# Patient Record
Sex: Male | Born: 1945 | Race: White | Hispanic: No | Marital: Married | Smoking: Former smoker
Health system: Southern US, Community
[De-identification: ages and names within clinical notes are randomized; demographics above are authoritative.]

## PROBLEM LIST (undated history)

## (undated) DIAGNOSIS — E119 Type 2 diabetes mellitus without complications: Secondary | ICD-10-CM

## (undated) DIAGNOSIS — I10 Essential (primary) hypertension: Secondary | ICD-10-CM

## (undated) DIAGNOSIS — I219 Acute myocardial infarction, unspecified: Secondary | ICD-10-CM

## (undated) DIAGNOSIS — E0590 Thyrotoxicosis, unspecified without thyrotoxic crisis or storm: Secondary | ICD-10-CM

## (undated) DIAGNOSIS — K219 Gastro-esophageal reflux disease without esophagitis: Secondary | ICD-10-CM

## (undated) HISTORY — PX: NECK SURGERY: SHX720

## (undated) HISTORY — PX: STOMACH SURGERY: SHX791

## (undated) HISTORY — DX: Essential (primary) hypertension: I10

## (undated) HISTORY — DX: Acute myocardial infarction, unspecified: I21.9

## (undated) HISTORY — PX: CHOLECYSTECTOMY: SHX55

## (undated) HISTORY — DX: Type 2 diabetes mellitus without complications: E11.9

## (undated) HISTORY — PX: OTHER SURGICAL HISTORY: SHX169

## (undated) HISTORY — DX: Gastro-esophageal reflux disease without esophagitis: K21.9

## (undated) HISTORY — DX: Thyrotoxicosis, unspecified without thyrotoxic crisis or storm: E05.90

---

## 1989-09-05 DIAGNOSIS — I219 Acute myocardial infarction, unspecified: Secondary | ICD-10-CM

## 1989-09-05 HISTORY — DX: Acute myocardial infarction, unspecified: I21.9

## 1998-10-22 ENCOUNTER — Ambulatory Visit (HOSPITAL_COMMUNITY): Admission: RE | Admit: 1998-10-22 | Discharge: 1998-10-22 | Payer: Self-pay

## 2001-06-04 ENCOUNTER — Ambulatory Visit (HOSPITAL_COMMUNITY): Admission: RE | Admit: 2001-06-04 | Discharge: 2001-06-04 | Payer: Self-pay | Admitting: Gastroenterology

## 2001-06-14 ENCOUNTER — Encounter: Admission: RE | Admit: 2001-06-14 | Discharge: 2001-06-14 | Payer: Self-pay | Admitting: Gastroenterology

## 2001-06-14 ENCOUNTER — Encounter: Payer: Self-pay | Admitting: Gastroenterology

## 2005-01-25 ENCOUNTER — Encounter: Admission: RE | Admit: 2005-01-25 | Discharge: 2005-01-25 | Payer: Self-pay | Admitting: Family Medicine

## 2005-09-07 ENCOUNTER — Encounter: Admission: RE | Admit: 2005-09-07 | Discharge: 2005-09-07 | Payer: Self-pay | Admitting: Family Medicine

## 2005-12-14 ENCOUNTER — Ambulatory Visit: Admission: RE | Admit: 2005-12-14 | Discharge: 2005-12-14 | Payer: Self-pay | Admitting: Orthopedic Surgery

## 2006-03-31 ENCOUNTER — Encounter: Admission: RE | Admit: 2006-03-31 | Discharge: 2006-03-31 | Payer: Self-pay | Admitting: Cardiology

## 2006-04-05 ENCOUNTER — Ambulatory Visit: Payer: Self-pay | Admitting: Family Medicine

## 2006-08-08 ENCOUNTER — Ambulatory Visit: Payer: Self-pay | Admitting: Family Medicine

## 2006-08-17 ENCOUNTER — Ambulatory Visit: Payer: Self-pay | Admitting: Family Medicine

## 2006-12-21 ENCOUNTER — Ambulatory Visit: Payer: Self-pay | Admitting: Family Medicine

## 2006-12-25 ENCOUNTER — Ambulatory Visit: Payer: Self-pay | Admitting: Family Medicine

## 2006-12-27 ENCOUNTER — Ambulatory Visit: Payer: Self-pay | Admitting: Family Medicine

## 2007-01-01 ENCOUNTER — Ambulatory Visit: Payer: Self-pay | Admitting: Family Medicine

## 2007-05-03 ENCOUNTER — Ambulatory Visit: Payer: Self-pay | Admitting: Family Medicine

## 2008-03-21 ENCOUNTER — Ambulatory Visit: Payer: Self-pay | Admitting: Family Medicine

## 2008-05-28 ENCOUNTER — Ambulatory Visit: Payer: Self-pay | Admitting: Thoracic Surgery (Cardiothoracic Vascular Surgery)

## 2008-09-11 ENCOUNTER — Ambulatory Visit: Payer: Self-pay | Admitting: Family Medicine

## 2008-09-17 ENCOUNTER — Ambulatory Visit: Payer: Self-pay | Admitting: Family Medicine

## 2009-02-20 ENCOUNTER — Ambulatory Visit: Payer: Self-pay | Admitting: Family Medicine

## 2009-04-09 ENCOUNTER — Ambulatory Visit: Payer: Self-pay | Admitting: Family Medicine

## 2009-10-13 ENCOUNTER — Ambulatory Visit: Payer: Self-pay | Admitting: Family Medicine

## 2011-01-21 NOTE — Procedures (Signed)
[facility]  Patient:    RICAHRD, SCHWAGER Visit Number: [id number] MRN: [id number]          Service Type: END Location: ENDO Attending Physician:  Orland Mustard Proc. Date: 06/04/01 Admit Date:  06/04/2001   CC:         Gaspar Garbe B. Little, M.D.   Procedure Report  PROCEDURE:  Colonoscopy.  MEDICATIONS:  Fentanyl 75 mcg, Versed 8 mg IV.  SCOPE:  Adult Olympus video colonoscope.  INDICATIONS:  Family history of colon polyps.  DESCRIPTION OF PROCEDURE:  The procedure had been explained to the patient and consent obtained.  With the patient in the left lateral decubitus position, the Olympus adult video colonoscope was inserted and advanced under direct visualization.  The prep was excellent.  We advanced to the cecum without difficulty.  The ileocecal valve and appendiceal orifice seen.  The scope was withdrawn.  The cecum, ascending colon, hepatic flexure, transverse colon, splenic flexure, descending, and sigmoid colon were seen well upon removal, and no polyps or other lesions seen.  The scope was withdrawn.  The patient tolerated the procedure well.  ASSESSMENT:  Normal colonoscopy in a high-risk individual.  PLAN:  We will recommend repeating in five years. Attending Physician:  Orland Mustard DD:  06/04/01 TD:  06/04/01 Job: [id number]

## 2011-07-12 ENCOUNTER — Telehealth: Payer: Self-pay | Admitting: Family Medicine

## 2011-07-12 NOTE — Telephone Encounter (Signed)
DONE

## 2011-11-21 DIAGNOSIS — Z0271 Encounter for disability determination: Secondary | ICD-10-CM

## 2014-12-04 DIAGNOSIS — M1711 Unilateral primary osteoarthritis, right knee: Secondary | ICD-10-CM | POA: Insufficient documentation

## 2015-02-26 DIAGNOSIS — M25562 Pain in left knee: Secondary | ICD-10-CM | POA: Insufficient documentation

## 2015-07-17 DIAGNOSIS — I2510 Atherosclerotic heart disease of native coronary artery without angina pectoris: Secondary | ICD-10-CM | POA: Insufficient documentation

## 2015-07-17 DIAGNOSIS — Z951 Presence of aortocoronary bypass graft: Secondary | ICD-10-CM | POA: Insufficient documentation

## 2015-07-17 DIAGNOSIS — I10 Essential (primary) hypertension: Secondary | ICD-10-CM | POA: Insufficient documentation

## 2015-07-17 DIAGNOSIS — E119 Type 2 diabetes mellitus without complications: Secondary | ICD-10-CM | POA: Insufficient documentation

## 2015-07-17 DIAGNOSIS — E785 Hyperlipidemia, unspecified: Secondary | ICD-10-CM | POA: Insufficient documentation

## 2015-09-17 DIAGNOSIS — M4722 Other spondylosis with radiculopathy, cervical region: Secondary | ICD-10-CM | POA: Diagnosis not present

## 2015-09-17 DIAGNOSIS — Z951 Presence of aortocoronary bypass graft: Secondary | ICD-10-CM | POA: Diagnosis not present

## 2015-09-17 DIAGNOSIS — M4712 Other spondylosis with myelopathy, cervical region: Secondary | ICD-10-CM | POA: Diagnosis not present

## 2015-09-17 DIAGNOSIS — Z01811 Encounter for preprocedural respiratory examination: Secondary | ICD-10-CM | POA: Diagnosis not present

## 2015-09-23 DIAGNOSIS — I2510 Atherosclerotic heart disease of native coronary artery without angina pectoris: Secondary | ICD-10-CM | POA: Diagnosis not present

## 2015-09-23 DIAGNOSIS — E784 Other hyperlipidemia: Secondary | ICD-10-CM | POA: Diagnosis not present

## 2015-09-23 DIAGNOSIS — I10 Essential (primary) hypertension: Secondary | ICD-10-CM | POA: Diagnosis not present

## 2015-09-23 DIAGNOSIS — R9431 Abnormal electrocardiogram [ECG] [EKG]: Secondary | ICD-10-CM | POA: Diagnosis not present

## 2015-09-23 DIAGNOSIS — Z951 Presence of aortocoronary bypass graft: Secondary | ICD-10-CM | POA: Diagnosis not present

## 2015-09-24 DIAGNOSIS — R9431 Abnormal electrocardiogram [ECG] [EKG]: Secondary | ICD-10-CM | POA: Diagnosis not present

## 2015-09-24 DIAGNOSIS — I2510 Atherosclerotic heart disease of native coronary artery without angina pectoris: Secondary | ICD-10-CM | POA: Diagnosis not present

## 2015-10-14 DIAGNOSIS — M25562 Pain in left knee: Secondary | ICD-10-CM | POA: Diagnosis not present

## 2015-10-15 DIAGNOSIS — G5621 Lesion of ulnar nerve, right upper limb: Secondary | ICD-10-CM | POA: Diagnosis not present

## 2015-10-15 DIAGNOSIS — M4722 Other spondylosis with radiculopathy, cervical region: Secondary | ICD-10-CM | POA: Diagnosis not present

## 2015-10-15 DIAGNOSIS — M4806 Spinal stenosis, lumbar region: Secondary | ICD-10-CM | POA: Diagnosis not present

## 2015-10-15 DIAGNOSIS — M6281 Muscle weakness (generalized): Secondary | ICD-10-CM | POA: Diagnosis not present

## 2015-10-15 DIAGNOSIS — R2689 Other abnormalities of gait and mobility: Secondary | ICD-10-CM | POA: Diagnosis not present

## 2015-10-15 DIAGNOSIS — Z951 Presence of aortocoronary bypass graft: Secondary | ICD-10-CM | POA: Diagnosis not present

## 2015-10-15 DIAGNOSIS — M4712 Other spondylosis with myelopathy, cervical region: Secondary | ICD-10-CM | POA: Diagnosis not present

## 2015-10-15 DIAGNOSIS — E0842 Diabetes mellitus due to underlying condition with diabetic polyneuropathy: Secondary | ICD-10-CM | POA: Diagnosis not present

## 2015-10-19 DIAGNOSIS — M1712 Unilateral primary osteoarthritis, left knee: Secondary | ICD-10-CM | POA: Diagnosis not present

## 2015-10-28 DIAGNOSIS — R208 Other disturbances of skin sensation: Secondary | ICD-10-CM | POA: Diagnosis not present

## 2015-10-28 DIAGNOSIS — M4322 Fusion of spine, cervical region: Secondary | ICD-10-CM | POA: Diagnosis not present

## 2015-10-28 DIAGNOSIS — I25810 Atherosclerosis of coronary artery bypass graft(s) without angina pectoris: Secondary | ICD-10-CM | POA: Diagnosis not present

## 2015-10-28 DIAGNOSIS — M4802 Spinal stenosis, cervical region: Secondary | ICD-10-CM | POA: Diagnosis not present

## 2015-10-28 DIAGNOSIS — Z79899 Other long term (current) drug therapy: Secondary | ICD-10-CM | POA: Diagnosis not present

## 2015-10-28 DIAGNOSIS — M4722 Other spondylosis with radiculopathy, cervical region: Secondary | ICD-10-CM | POA: Diagnosis not present

## 2015-10-28 DIAGNOSIS — Z9889 Other specified postprocedural states: Secondary | ICD-10-CM | POA: Diagnosis not present

## 2015-10-28 DIAGNOSIS — Z7984 Long term (current) use of oral hypoglycemic drugs: Secondary | ICD-10-CM | POA: Diagnosis not present

## 2015-10-28 DIAGNOSIS — E785 Hyperlipidemia, unspecified: Secondary | ICD-10-CM | POA: Diagnosis not present

## 2015-10-28 DIAGNOSIS — Z955 Presence of coronary angioplasty implant and graft: Secondary | ICD-10-CM | POA: Diagnosis not present

## 2015-10-28 DIAGNOSIS — I252 Old myocardial infarction: Secondary | ICD-10-CM | POA: Diagnosis not present

## 2015-10-28 DIAGNOSIS — K219 Gastro-esophageal reflux disease without esophagitis: Secondary | ICD-10-CM | POA: Diagnosis not present

## 2015-10-28 DIAGNOSIS — E119 Type 2 diabetes mellitus without complications: Secondary | ICD-10-CM | POA: Diagnosis not present

## 2015-10-28 DIAGNOSIS — Z951 Presence of aortocoronary bypass graft: Secondary | ICD-10-CM | POA: Diagnosis not present

## 2015-10-28 DIAGNOSIS — K449 Diaphragmatic hernia without obstruction or gangrene: Secondary | ICD-10-CM | POA: Diagnosis not present

## 2015-10-28 DIAGNOSIS — I10 Essential (primary) hypertension: Secondary | ICD-10-CM | POA: Diagnosis not present

## 2015-10-28 DIAGNOSIS — M4712 Other spondylosis with myelopathy, cervical region: Secondary | ICD-10-CM | POA: Diagnosis not present

## 2015-10-28 DIAGNOSIS — E039 Hypothyroidism, unspecified: Secondary | ICD-10-CM | POA: Diagnosis not present

## 2015-10-29 DIAGNOSIS — E119 Type 2 diabetes mellitus without complications: Secondary | ICD-10-CM | POA: Diagnosis not present

## 2015-10-29 DIAGNOSIS — M4712 Other spondylosis with myelopathy, cervical region: Secondary | ICD-10-CM | POA: Diagnosis not present

## 2015-11-09 DIAGNOSIS — M48061 Spinal stenosis, lumbar region without neurogenic claudication: Secondary | ICD-10-CM | POA: Insufficient documentation

## 2015-11-09 DIAGNOSIS — G5620 Lesion of ulnar nerve, unspecified upper limb: Secondary | ICD-10-CM | POA: Insufficient documentation

## 2015-11-24 DIAGNOSIS — M19071 Primary osteoarthritis, right ankle and foot: Secondary | ICD-10-CM | POA: Diagnosis not present

## 2015-11-24 DIAGNOSIS — L84 Corns and callosities: Secondary | ICD-10-CM | POA: Diagnosis not present

## 2015-11-24 DIAGNOSIS — M2141 Flat foot [pes planus] (acquired), right foot: Secondary | ICD-10-CM | POA: Diagnosis not present

## 2015-11-24 DIAGNOSIS — M79671 Pain in right foot: Secondary | ICD-10-CM | POA: Diagnosis not present

## 2015-11-25 DIAGNOSIS — Z981 Arthrodesis status: Secondary | ICD-10-CM | POA: Diagnosis not present

## 2015-11-25 DIAGNOSIS — M4712 Other spondylosis with myelopathy, cervical region: Secondary | ICD-10-CM | POA: Diagnosis not present

## 2015-11-25 DIAGNOSIS — Z09 Encounter for follow-up examination after completed treatment for conditions other than malignant neoplasm: Secondary | ICD-10-CM | POA: Diagnosis not present

## 2015-11-25 DIAGNOSIS — M2578 Osteophyte, vertebrae: Secondary | ICD-10-CM | POA: Diagnosis not present

## 2015-12-08 DIAGNOSIS — L97511 Non-pressure chronic ulcer of other part of right foot limited to breakdown of skin: Secondary | ICD-10-CM | POA: Diagnosis not present

## 2015-12-08 DIAGNOSIS — E1142 Type 2 diabetes mellitus with diabetic polyneuropathy: Secondary | ICD-10-CM | POA: Diagnosis not present

## 2015-12-08 DIAGNOSIS — M14671 Charcot's joint, right ankle and foot: Secondary | ICD-10-CM | POA: Insufficient documentation

## 2015-12-17 DIAGNOSIS — E1142 Type 2 diabetes mellitus with diabetic polyneuropathy: Secondary | ICD-10-CM | POA: Diagnosis not present

## 2015-12-17 DIAGNOSIS — M14671 Charcot's joint, right ankle and foot: Secondary | ICD-10-CM | POA: Diagnosis not present

## 2015-12-17 DIAGNOSIS — L97511 Non-pressure chronic ulcer of other part of right foot limited to breakdown of skin: Secondary | ICD-10-CM | POA: Diagnosis not present

## 2015-12-31 DIAGNOSIS — L97511 Non-pressure chronic ulcer of other part of right foot limited to breakdown of skin: Secondary | ICD-10-CM | POA: Diagnosis not present

## 2015-12-31 DIAGNOSIS — M14671 Charcot's joint, right ankle and foot: Secondary | ICD-10-CM | POA: Diagnosis not present

## 2015-12-31 DIAGNOSIS — E1142 Type 2 diabetes mellitus with diabetic polyneuropathy: Secondary | ICD-10-CM | POA: Diagnosis not present

## 2016-01-14 DIAGNOSIS — M14671 Charcot's joint, right ankle and foot: Secondary | ICD-10-CM | POA: Diagnosis not present

## 2016-01-14 DIAGNOSIS — L97511 Non-pressure chronic ulcer of other part of right foot limited to breakdown of skin: Secondary | ICD-10-CM | POA: Diagnosis not present

## 2016-01-14 DIAGNOSIS — E1142 Type 2 diabetes mellitus with diabetic polyneuropathy: Secondary | ICD-10-CM | POA: Diagnosis not present

## 2016-01-21 DIAGNOSIS — M79671 Pain in right foot: Secondary | ICD-10-CM | POA: Diagnosis not present

## 2016-01-21 DIAGNOSIS — E1142 Type 2 diabetes mellitus with diabetic polyneuropathy: Secondary | ICD-10-CM | POA: Diagnosis not present

## 2016-01-21 DIAGNOSIS — L84 Corns and callosities: Secondary | ICD-10-CM | POA: Diagnosis not present

## 2016-02-03 DIAGNOSIS — E119 Type 2 diabetes mellitus without complications: Secondary | ICD-10-CM | POA: Diagnosis not present

## 2016-02-05 DIAGNOSIS — M4806 Spinal stenosis, lumbar region: Secondary | ICD-10-CM | POA: Diagnosis not present

## 2016-02-16 DIAGNOSIS — E1142 Type 2 diabetes mellitus with diabetic polyneuropathy: Secondary | ICD-10-CM | POA: Diagnosis not present

## 2016-02-16 DIAGNOSIS — M14671 Charcot's joint, right ankle and foot: Secondary | ICD-10-CM | POA: Diagnosis not present

## 2016-02-16 DIAGNOSIS — L97511 Non-pressure chronic ulcer of other part of right foot limited to breakdown of skin: Secondary | ICD-10-CM | POA: Diagnosis not present

## 2016-03-17 DIAGNOSIS — M14671 Charcot's joint, right ankle and foot: Secondary | ICD-10-CM | POA: Diagnosis not present

## 2016-03-17 DIAGNOSIS — E1142 Type 2 diabetes mellitus with diabetic polyneuropathy: Secondary | ICD-10-CM | POA: Diagnosis not present

## 2016-03-28 DIAGNOSIS — Z01818 Encounter for other preprocedural examination: Secondary | ICD-10-CM | POA: Diagnosis not present

## 2016-04-16 DIAGNOSIS — Z981 Arthrodesis status: Secondary | ICD-10-CM | POA: Insufficient documentation

## 2016-04-18 DIAGNOSIS — R262 Difficulty in walking, not elsewhere classified: Secondary | ICD-10-CM | POA: Diagnosis not present

## 2016-04-18 DIAGNOSIS — M170 Bilateral primary osteoarthritis of knee: Secondary | ICD-10-CM | POA: Diagnosis not present

## 2016-04-18 DIAGNOSIS — M1712 Unilateral primary osteoarthritis, left knee: Secondary | ICD-10-CM | POA: Diagnosis not present

## 2016-04-18 DIAGNOSIS — M25561 Pain in right knee: Secondary | ICD-10-CM | POA: Diagnosis not present

## 2016-04-18 DIAGNOSIS — M25562 Pain in left knee: Secondary | ICD-10-CM | POA: Diagnosis not present

## 2016-04-25 DIAGNOSIS — E1142 Type 2 diabetes mellitus with diabetic polyneuropathy: Secondary | ICD-10-CM | POA: Diagnosis not present

## 2016-04-25 DIAGNOSIS — L97511 Non-pressure chronic ulcer of other part of right foot limited to breakdown of skin: Secondary | ICD-10-CM | POA: Diagnosis not present

## 2016-04-25 DIAGNOSIS — M14672 Charcot's joint, left ankle and foot: Secondary | ICD-10-CM | POA: Diagnosis not present

## 2016-04-25 DIAGNOSIS — M14671 Charcot's joint, right ankle and foot: Secondary | ICD-10-CM | POA: Diagnosis not present

## 2016-05-05 DIAGNOSIS — L97512 Non-pressure chronic ulcer of other part of right foot with fat layer exposed: Secondary | ICD-10-CM | POA: Diagnosis not present

## 2016-05-05 DIAGNOSIS — L03115 Cellulitis of right lower limb: Secondary | ICD-10-CM | POA: Diagnosis not present

## 2016-05-05 DIAGNOSIS — E1142 Type 2 diabetes mellitus with diabetic polyneuropathy: Secondary | ICD-10-CM | POA: Diagnosis not present

## 2016-05-05 DIAGNOSIS — M14671 Charcot's joint, right ankle and foot: Secondary | ICD-10-CM | POA: Diagnosis not present

## 2016-05-12 DIAGNOSIS — L97511 Non-pressure chronic ulcer of other part of right foot limited to breakdown of skin: Secondary | ICD-10-CM | POA: Diagnosis not present

## 2016-05-12 DIAGNOSIS — E1142 Type 2 diabetes mellitus with diabetic polyneuropathy: Secondary | ICD-10-CM | POA: Diagnosis not present

## 2016-05-12 DIAGNOSIS — M14671 Charcot's joint, right ankle and foot: Secondary | ICD-10-CM | POA: Diagnosis not present

## 2016-05-18 DIAGNOSIS — M4806 Spinal stenosis, lumbar region: Secondary | ICD-10-CM | POA: Diagnosis not present

## 2016-05-26 DIAGNOSIS — M14671 Charcot's joint, right ankle and foot: Secondary | ICD-10-CM | POA: Diagnosis not present

## 2016-05-26 DIAGNOSIS — E1142 Type 2 diabetes mellitus with diabetic polyneuropathy: Secondary | ICD-10-CM | POA: Diagnosis not present

## 2016-05-26 DIAGNOSIS — L97511 Non-pressure chronic ulcer of other part of right foot limited to breakdown of skin: Secondary | ICD-10-CM | POA: Diagnosis not present

## 2016-05-31 DIAGNOSIS — M6701 Short Achilles tendon (acquired), right ankle: Secondary | ICD-10-CM | POA: Diagnosis not present

## 2016-05-31 DIAGNOSIS — M14671 Charcot's joint, right ankle and foot: Secondary | ICD-10-CM | POA: Diagnosis not present

## 2016-05-31 DIAGNOSIS — L97512 Non-pressure chronic ulcer of other part of right foot with fat layer exposed: Secondary | ICD-10-CM | POA: Diagnosis not present

## 2016-05-31 DIAGNOSIS — E1140 Type 2 diabetes mellitus with diabetic neuropathy, unspecified: Secondary | ICD-10-CM | POA: Diagnosis not present

## 2016-05-31 DIAGNOSIS — L97519 Non-pressure chronic ulcer of other part of right foot with unspecified severity: Secondary | ICD-10-CM | POA: Diagnosis not present

## 2016-05-31 DIAGNOSIS — Z794 Long term (current) use of insulin: Secondary | ICD-10-CM | POA: Diagnosis not present

## 2016-06-14 DIAGNOSIS — M14671 Charcot's joint, right ankle and foot: Secondary | ICD-10-CM | POA: Diagnosis not present

## 2016-06-14 DIAGNOSIS — L97512 Non-pressure chronic ulcer of other part of right foot with fat layer exposed: Secondary | ICD-10-CM | POA: Diagnosis not present

## 2016-06-14 DIAGNOSIS — E1142 Type 2 diabetes mellitus with diabetic polyneuropathy: Secondary | ICD-10-CM | POA: Diagnosis not present

## 2016-06-17 DIAGNOSIS — R0602 Shortness of breath: Secondary | ICD-10-CM | POA: Diagnosis not present

## 2016-06-17 DIAGNOSIS — I2510 Atherosclerotic heart disease of native coronary artery without angina pectoris: Secondary | ICD-10-CM | POA: Diagnosis not present

## 2016-06-21 DIAGNOSIS — M14671 Charcot's joint, right ankle and foot: Secondary | ICD-10-CM | POA: Diagnosis not present

## 2016-06-21 DIAGNOSIS — L97512 Non-pressure chronic ulcer of other part of right foot with fat layer exposed: Secondary | ICD-10-CM | POA: Diagnosis not present

## 2016-06-21 DIAGNOSIS — M6701 Short Achilles tendon (acquired), right ankle: Secondary | ICD-10-CM | POA: Diagnosis not present

## 2016-07-05 DIAGNOSIS — E1142 Type 2 diabetes mellitus with diabetic polyneuropathy: Secondary | ICD-10-CM | POA: Diagnosis not present

## 2016-07-05 DIAGNOSIS — L97511 Non-pressure chronic ulcer of other part of right foot limited to breakdown of skin: Secondary | ICD-10-CM | POA: Diagnosis not present

## 2016-07-19 DIAGNOSIS — E1165 Type 2 diabetes mellitus with hyperglycemia: Secondary | ICD-10-CM | POA: Diagnosis not present

## 2016-07-19 DIAGNOSIS — E1142 Type 2 diabetes mellitus with diabetic polyneuropathy: Secondary | ICD-10-CM | POA: Diagnosis not present

## 2016-07-19 DIAGNOSIS — L97511 Non-pressure chronic ulcer of other part of right foot limited to breakdown of skin: Secondary | ICD-10-CM | POA: Diagnosis not present

## 2016-07-19 DIAGNOSIS — M14671 Charcot's joint, right ankle and foot: Secondary | ICD-10-CM | POA: Diagnosis not present

## 2016-07-20 DIAGNOSIS — G4733 Obstructive sleep apnea (adult) (pediatric): Secondary | ICD-10-CM | POA: Diagnosis not present

## 2016-07-20 DIAGNOSIS — I252 Old myocardial infarction: Secondary | ICD-10-CM | POA: Diagnosis not present

## 2016-07-20 DIAGNOSIS — E1140 Type 2 diabetes mellitus with diabetic neuropathy, unspecified: Secondary | ICD-10-CM | POA: Diagnosis not present

## 2016-07-20 DIAGNOSIS — E669 Obesity, unspecified: Secondary | ICD-10-CM | POA: Diagnosis not present

## 2016-07-20 DIAGNOSIS — E785 Hyperlipidemia, unspecified: Secondary | ICD-10-CM | POA: Diagnosis not present

## 2016-07-20 DIAGNOSIS — M1990 Unspecified osteoarthritis, unspecified site: Secondary | ICD-10-CM | POA: Diagnosis not present

## 2016-07-20 DIAGNOSIS — Z6834 Body mass index (BMI) 34.0-34.9, adult: Secondary | ICD-10-CM | POA: Diagnosis not present

## 2016-07-20 DIAGNOSIS — K219 Gastro-esophageal reflux disease without esophagitis: Secondary | ICD-10-CM | POA: Diagnosis not present

## 2016-07-20 DIAGNOSIS — R911 Solitary pulmonary nodule: Secondary | ICD-10-CM | POA: Diagnosis not present

## 2016-07-20 DIAGNOSIS — R531 Weakness: Secondary | ICD-10-CM | POA: Diagnosis not present

## 2016-07-20 DIAGNOSIS — A419 Sepsis, unspecified organism: Secondary | ICD-10-CM | POA: Diagnosis not present

## 2016-07-20 DIAGNOSIS — R079 Chest pain, unspecified: Secondary | ICD-10-CM | POA: Diagnosis not present

## 2016-07-20 DIAGNOSIS — Z7952 Long term (current) use of systemic steroids: Secondary | ICD-10-CM | POA: Diagnosis not present

## 2016-07-20 DIAGNOSIS — Z951 Presence of aortocoronary bypass graft: Secondary | ICD-10-CM | POA: Diagnosis not present

## 2016-07-20 DIAGNOSIS — J159 Unspecified bacterial pneumonia: Secondary | ICD-10-CM | POA: Diagnosis not present

## 2016-07-20 DIAGNOSIS — Z87891 Personal history of nicotine dependence: Secondary | ICD-10-CM | POA: Diagnosis not present

## 2016-07-20 DIAGNOSIS — Z7982 Long term (current) use of aspirin: Secondary | ICD-10-CM | POA: Diagnosis not present

## 2016-07-20 DIAGNOSIS — R05 Cough: Secondary | ICD-10-CM | POA: Diagnosis not present

## 2016-07-20 DIAGNOSIS — Z7984 Long term (current) use of oral hypoglycemic drugs: Secondary | ICD-10-CM | POA: Diagnosis not present

## 2016-07-20 DIAGNOSIS — Z8673 Personal history of transient ischemic attack (TIA), and cerebral infarction without residual deficits: Secondary | ICD-10-CM | POA: Diagnosis not present

## 2016-07-20 DIAGNOSIS — I2510 Atherosclerotic heart disease of native coronary artery without angina pectoris: Secondary | ICD-10-CM | POA: Diagnosis not present

## 2016-07-20 DIAGNOSIS — I10 Essential (primary) hypertension: Secondary | ICD-10-CM | POA: Diagnosis not present

## 2016-07-20 DIAGNOSIS — E039 Hypothyroidism, unspecified: Secondary | ICD-10-CM | POA: Diagnosis not present

## 2016-07-21 DIAGNOSIS — I361 Nonrheumatic tricuspid (valve) insufficiency: Secondary | ICD-10-CM | POA: Diagnosis not present

## 2016-07-21 DIAGNOSIS — E785 Hyperlipidemia, unspecified: Secondary | ICD-10-CM

## 2016-07-21 DIAGNOSIS — J159 Unspecified bacterial pneumonia: Secondary | ICD-10-CM | POA: Diagnosis not present

## 2016-07-21 DIAGNOSIS — E039 Hypothyroidism, unspecified: Secondary | ICD-10-CM

## 2016-07-21 DIAGNOSIS — G4733 Obstructive sleep apnea (adult) (pediatric): Secondary | ICD-10-CM

## 2016-07-21 DIAGNOSIS — M1990 Unspecified osteoarthritis, unspecified site: Secondary | ICD-10-CM

## 2016-07-21 DIAGNOSIS — R911 Solitary pulmonary nodule: Secondary | ICD-10-CM | POA: Diagnosis not present

## 2016-07-21 DIAGNOSIS — I10 Essential (primary) hypertension: Secondary | ICD-10-CM

## 2016-07-21 DIAGNOSIS — Z951 Presence of aortocoronary bypass graft: Secondary | ICD-10-CM

## 2016-07-21 DIAGNOSIS — R0600 Dyspnea, unspecified: Secondary | ICD-10-CM | POA: Diagnosis not present

## 2016-07-21 DIAGNOSIS — E1140 Type 2 diabetes mellitus with diabetic neuropathy, unspecified: Secondary | ICD-10-CM

## 2016-07-21 DIAGNOSIS — I2510 Atherosclerotic heart disease of native coronary artery without angina pectoris: Secondary | ICD-10-CM | POA: Diagnosis not present

## 2016-07-21 DIAGNOSIS — A419 Sepsis, unspecified organism: Secondary | ICD-10-CM | POA: Diagnosis not present

## 2016-07-21 DIAGNOSIS — E669 Obesity, unspecified: Secondary | ICD-10-CM

## 2016-07-26 DIAGNOSIS — R062 Wheezing: Secondary | ICD-10-CM | POA: Diagnosis not present

## 2016-07-26 DIAGNOSIS — J181 Lobar pneumonia, unspecified organism: Secondary | ICD-10-CM | POA: Diagnosis not present

## 2016-07-26 DIAGNOSIS — R911 Solitary pulmonary nodule: Secondary | ICD-10-CM | POA: Diagnosis not present

## 2016-08-02 DIAGNOSIS — M6701 Short Achilles tendon (acquired), right ankle: Secondary | ICD-10-CM | POA: Diagnosis not present

## 2016-08-02 DIAGNOSIS — M14671 Charcot's joint, right ankle and foot: Secondary | ICD-10-CM | POA: Diagnosis not present

## 2016-08-02 DIAGNOSIS — L97512 Non-pressure chronic ulcer of other part of right foot with fat layer exposed: Secondary | ICD-10-CM | POA: Diagnosis not present

## 2016-08-02 DIAGNOSIS — E1140 Type 2 diabetes mellitus with diabetic neuropathy, unspecified: Secondary | ICD-10-CM | POA: Diagnosis not present

## 2016-08-02 DIAGNOSIS — M7751 Other enthesopathy of right foot: Secondary | ICD-10-CM | POA: Diagnosis not present

## 2016-08-05 DIAGNOSIS — E1142 Type 2 diabetes mellitus with diabetic polyneuropathy: Secondary | ICD-10-CM | POA: Diagnosis not present

## 2016-08-05 DIAGNOSIS — B370 Candidal stomatitis: Secondary | ICD-10-CM | POA: Diagnosis not present

## 2016-08-05 DIAGNOSIS — I10 Essential (primary) hypertension: Secondary | ICD-10-CM | POA: Diagnosis not present

## 2016-08-05 DIAGNOSIS — E1165 Type 2 diabetes mellitus with hyperglycemia: Secondary | ICD-10-CM | POA: Diagnosis not present

## 2016-08-08 DIAGNOSIS — E119 Type 2 diabetes mellitus without complications: Secondary | ICD-10-CM | POA: Diagnosis not present

## 2016-08-16 DIAGNOSIS — M7751 Other enthesopathy of right foot: Secondary | ICD-10-CM | POA: Diagnosis not present

## 2016-08-16 DIAGNOSIS — M14671 Charcot's joint, right ankle and foot: Secondary | ICD-10-CM | POA: Diagnosis not present

## 2016-08-16 DIAGNOSIS — E1142 Type 2 diabetes mellitus with diabetic polyneuropathy: Secondary | ICD-10-CM | POA: Diagnosis not present

## 2016-08-16 DIAGNOSIS — L97512 Non-pressure chronic ulcer of other part of right foot with fat layer exposed: Secondary | ICD-10-CM | POA: Diagnosis not present

## 2016-09-06 DIAGNOSIS — L97512 Non-pressure chronic ulcer of other part of right foot with fat layer exposed: Secondary | ICD-10-CM | POA: Diagnosis not present

## 2016-09-19 DIAGNOSIS — E119 Type 2 diabetes mellitus without complications: Secondary | ICD-10-CM | POA: Diagnosis not present

## 2016-09-30 DIAGNOSIS — R221 Localized swelling, mass and lump, neck: Secondary | ICD-10-CM | POA: Diagnosis not present

## 2016-10-04 DIAGNOSIS — L97512 Non-pressure chronic ulcer of other part of right foot with fat layer exposed: Secondary | ICD-10-CM | POA: Diagnosis not present

## 2016-10-04 DIAGNOSIS — E1142 Type 2 diabetes mellitus with diabetic polyneuropathy: Secondary | ICD-10-CM | POA: Diagnosis not present

## 2016-10-04 DIAGNOSIS — M6701 Short Achilles tendon (acquired), right ankle: Secondary | ICD-10-CM | POA: Diagnosis not present

## 2016-10-06 DIAGNOSIS — M21071 Valgus deformity, not elsewhere classified, right ankle: Secondary | ICD-10-CM | POA: Insufficient documentation

## 2016-10-24 DIAGNOSIS — E1165 Type 2 diabetes mellitus with hyperglycemia: Secondary | ICD-10-CM | POA: Diagnosis not present

## 2016-10-25 DIAGNOSIS — M7751 Other enthesopathy of right foot: Secondary | ICD-10-CM | POA: Diagnosis not present

## 2016-10-25 DIAGNOSIS — L97512 Non-pressure chronic ulcer of other part of right foot with fat layer exposed: Secondary | ICD-10-CM | POA: Diagnosis not present

## 2016-10-25 DIAGNOSIS — M216X1 Other acquired deformities of right foot: Secondary | ICD-10-CM | POA: Diagnosis not present

## 2016-10-25 DIAGNOSIS — E1142 Type 2 diabetes mellitus with diabetic polyneuropathy: Secondary | ICD-10-CM | POA: Diagnosis not present

## 2016-10-25 DIAGNOSIS — M6701 Short Achilles tendon (acquired), right ankle: Secondary | ICD-10-CM | POA: Diagnosis not present

## 2016-10-25 DIAGNOSIS — L03115 Cellulitis of right lower limb: Secondary | ICD-10-CM | POA: Diagnosis not present

## 2016-11-01 DIAGNOSIS — M7751 Other enthesopathy of right foot: Secondary | ICD-10-CM | POA: Diagnosis not present

## 2016-11-01 DIAGNOSIS — L97512 Non-pressure chronic ulcer of other part of right foot with fat layer exposed: Secondary | ICD-10-CM | POA: Diagnosis not present

## 2016-11-01 DIAGNOSIS — M14671 Charcot's joint, right ankle and foot: Secondary | ICD-10-CM | POA: Diagnosis not present

## 2016-11-15 DIAGNOSIS — M6701 Short Achilles tendon (acquired), right ankle: Secondary | ICD-10-CM | POA: Diagnosis not present

## 2016-11-15 DIAGNOSIS — E1142 Type 2 diabetes mellitus with diabetic polyneuropathy: Secondary | ICD-10-CM | POA: Diagnosis not present

## 2016-11-15 DIAGNOSIS — M216X1 Other acquired deformities of right foot: Secondary | ICD-10-CM | POA: Diagnosis not present

## 2016-11-15 DIAGNOSIS — L97512 Non-pressure chronic ulcer of other part of right foot with fat layer exposed: Secondary | ICD-10-CM | POA: Diagnosis not present

## 2016-12-06 DIAGNOSIS — I10 Essential (primary) hypertension: Secondary | ICD-10-CM | POA: Diagnosis not present

## 2016-12-06 DIAGNOSIS — E1142 Type 2 diabetes mellitus with diabetic polyneuropathy: Secondary | ICD-10-CM | POA: Diagnosis not present

## 2016-12-06 DIAGNOSIS — Z01810 Encounter for preprocedural cardiovascular examination: Secondary | ICD-10-CM | POA: Diagnosis not present

## 2016-12-06 DIAGNOSIS — E1165 Type 2 diabetes mellitus with hyperglycemia: Secondary | ICD-10-CM | POA: Diagnosis not present

## 2016-12-08 DIAGNOSIS — M14671 Charcot's joint, right ankle and foot: Secondary | ICD-10-CM | POA: Diagnosis not present

## 2016-12-08 DIAGNOSIS — M7751 Other enthesopathy of right foot: Secondary | ICD-10-CM | POA: Diagnosis not present

## 2016-12-08 DIAGNOSIS — L97512 Non-pressure chronic ulcer of other part of right foot with fat layer exposed: Secondary | ICD-10-CM | POA: Diagnosis not present

## 2016-12-16 DIAGNOSIS — M7751 Other enthesopathy of right foot: Secondary | ICD-10-CM | POA: Diagnosis not present

## 2016-12-16 DIAGNOSIS — E1142 Type 2 diabetes mellitus with diabetic polyneuropathy: Secondary | ICD-10-CM | POA: Diagnosis not present

## 2016-12-16 DIAGNOSIS — E785 Hyperlipidemia, unspecified: Secondary | ICD-10-CM | POA: Diagnosis not present

## 2016-12-16 DIAGNOSIS — Z7984 Long term (current) use of oral hypoglycemic drugs: Secondary | ICD-10-CM | POA: Diagnosis not present

## 2016-12-16 DIAGNOSIS — I10 Essential (primary) hypertension: Secondary | ICD-10-CM | POA: Diagnosis not present

## 2016-12-16 DIAGNOSIS — Z886 Allergy status to analgesic agent status: Secondary | ICD-10-CM | POA: Diagnosis not present

## 2016-12-16 DIAGNOSIS — Z87891 Personal history of nicotine dependence: Secondary | ICD-10-CM | POA: Diagnosis not present

## 2016-12-16 DIAGNOSIS — M25774 Osteophyte, right foot: Secondary | ICD-10-CM | POA: Diagnosis not present

## 2016-12-16 DIAGNOSIS — I2510 Atherosclerotic heart disease of native coronary artery without angina pectoris: Secondary | ICD-10-CM | POA: Diagnosis not present

## 2016-12-16 DIAGNOSIS — Z6830 Body mass index (BMI) 30.0-30.9, adult: Secondary | ICD-10-CM | POA: Diagnosis not present

## 2016-12-16 DIAGNOSIS — Z8673 Personal history of transient ischemic attack (TIA), and cerebral infarction without residual deficits: Secondary | ICD-10-CM | POA: Diagnosis not present

## 2016-12-16 DIAGNOSIS — Z955 Presence of coronary angioplasty implant and graft: Secondary | ICD-10-CM | POA: Diagnosis not present

## 2016-12-16 DIAGNOSIS — K219 Gastro-esophageal reflux disease without esophagitis: Secondary | ICD-10-CM | POA: Diagnosis not present

## 2016-12-16 DIAGNOSIS — Z951 Presence of aortocoronary bypass graft: Secondary | ICD-10-CM | POA: Diagnosis not present

## 2016-12-16 DIAGNOSIS — E669 Obesity, unspecified: Secondary | ICD-10-CM | POA: Diagnosis not present

## 2016-12-28 DIAGNOSIS — I10 Essential (primary) hypertension: Secondary | ICD-10-CM | POA: Diagnosis not present

## 2016-12-28 DIAGNOSIS — G4733 Obstructive sleep apnea (adult) (pediatric): Secondary | ICD-10-CM | POA: Diagnosis not present

## 2016-12-28 DIAGNOSIS — E1142 Type 2 diabetes mellitus with diabetic polyneuropathy: Secondary | ICD-10-CM | POA: Diagnosis not present

## 2017-01-17 DIAGNOSIS — M7751 Other enthesopathy of right foot: Secondary | ICD-10-CM | POA: Diagnosis not present

## 2017-01-19 DIAGNOSIS — D509 Iron deficiency anemia, unspecified: Secondary | ICD-10-CM | POA: Diagnosis not present

## 2017-01-19 DIAGNOSIS — G4733 Obstructive sleep apnea (adult) (pediatric): Secondary | ICD-10-CM | POA: Diagnosis not present

## 2017-01-19 DIAGNOSIS — Z79899 Other long term (current) drug therapy: Secondary | ICD-10-CM | POA: Diagnosis not present

## 2017-01-19 DIAGNOSIS — R5383 Other fatigue: Secondary | ICD-10-CM | POA: Diagnosis not present

## 2017-01-19 DIAGNOSIS — E039 Hypothyroidism, unspecified: Secondary | ICD-10-CM | POA: Diagnosis not present

## 2017-01-19 DIAGNOSIS — R160 Hepatomegaly, not elsewhere classified: Secondary | ICD-10-CM | POA: Diagnosis not present

## 2017-01-19 DIAGNOSIS — R140 Abdominal distension (gaseous): Secondary | ICD-10-CM | POA: Diagnosis not present

## 2017-01-24 DIAGNOSIS — Z951 Presence of aortocoronary bypass graft: Secondary | ICD-10-CM | POA: Diagnosis not present

## 2017-01-24 DIAGNOSIS — E785 Hyperlipidemia, unspecified: Secondary | ICD-10-CM | POA: Diagnosis not present

## 2017-01-24 DIAGNOSIS — I252 Old myocardial infarction: Secondary | ICD-10-CM | POA: Diagnosis not present

## 2017-01-24 DIAGNOSIS — R0602 Shortness of breath: Secondary | ICD-10-CM | POA: Diagnosis not present

## 2017-01-24 DIAGNOSIS — I10 Essential (primary) hypertension: Secondary | ICD-10-CM | POA: Diagnosis not present

## 2017-01-24 DIAGNOSIS — I2510 Atherosclerotic heart disease of native coronary artery without angina pectoris: Secondary | ICD-10-CM | POA: Diagnosis not present

## 2017-01-24 DIAGNOSIS — E119 Type 2 diabetes mellitus without complications: Secondary | ICD-10-CM | POA: Diagnosis not present

## 2017-02-01 DIAGNOSIS — I10 Essential (primary) hypertension: Secondary | ICD-10-CM | POA: Diagnosis not present

## 2017-02-01 DIAGNOSIS — E039 Hypothyroidism, unspecified: Secondary | ICD-10-CM | POA: Diagnosis not present

## 2017-02-01 DIAGNOSIS — E119 Type 2 diabetes mellitus without complications: Secondary | ICD-10-CM | POA: Diagnosis not present

## 2017-02-01 DIAGNOSIS — R609 Edema, unspecified: Secondary | ICD-10-CM | POA: Diagnosis not present

## 2017-02-08 DIAGNOSIS — E1142 Type 2 diabetes mellitus with diabetic polyneuropathy: Secondary | ICD-10-CM | POA: Diagnosis not present

## 2017-02-08 DIAGNOSIS — I10 Essential (primary) hypertension: Secondary | ICD-10-CM | POA: Diagnosis not present

## 2017-02-17 DIAGNOSIS — R1031 Right lower quadrant pain: Secondary | ICD-10-CM | POA: Diagnosis not present

## 2017-02-17 DIAGNOSIS — R634 Abnormal weight loss: Secondary | ICD-10-CM | POA: Diagnosis not present

## 2017-02-17 DIAGNOSIS — D509 Iron deficiency anemia, unspecified: Secondary | ICD-10-CM | POA: Diagnosis not present

## 2017-02-17 DIAGNOSIS — R197 Diarrhea, unspecified: Secondary | ICD-10-CM | POA: Diagnosis not present

## 2017-02-24 DIAGNOSIS — N400 Enlarged prostate without lower urinary tract symptoms: Secondary | ICD-10-CM | POA: Diagnosis not present

## 2017-02-24 DIAGNOSIS — R634 Abnormal weight loss: Secondary | ICD-10-CM | POA: Diagnosis not present

## 2017-02-24 DIAGNOSIS — R911 Solitary pulmonary nodule: Secondary | ICD-10-CM | POA: Diagnosis not present

## 2017-02-24 DIAGNOSIS — R1031 Right lower quadrant pain: Secondary | ICD-10-CM | POA: Diagnosis not present

## 2017-02-24 DIAGNOSIS — R197 Diarrhea, unspecified: Secondary | ICD-10-CM | POA: Diagnosis not present

## 2017-02-24 DIAGNOSIS — I2510 Atherosclerotic heart disease of native coronary artery without angina pectoris: Secondary | ICD-10-CM | POA: Diagnosis not present

## 2017-03-20 DIAGNOSIS — G4733 Obstructive sleep apnea (adult) (pediatric): Secondary | ICD-10-CM | POA: Diagnosis not present

## 2017-03-20 DIAGNOSIS — K5730 Diverticulosis of large intestine without perforation or abscess without bleeding: Secondary | ICD-10-CM | POA: Diagnosis not present

## 2017-03-20 DIAGNOSIS — I252 Old myocardial infarction: Secondary | ICD-10-CM | POA: Diagnosis not present

## 2017-03-20 DIAGNOSIS — R1031 Right lower quadrant pain: Secondary | ICD-10-CM | POA: Diagnosis not present

## 2017-03-20 DIAGNOSIS — K635 Polyp of colon: Secondary | ICD-10-CM | POA: Diagnosis not present

## 2017-03-20 DIAGNOSIS — Z87891 Personal history of nicotine dependence: Secondary | ICD-10-CM | POA: Diagnosis not present

## 2017-03-20 DIAGNOSIS — J449 Chronic obstructive pulmonary disease, unspecified: Secondary | ICD-10-CM | POA: Diagnosis not present

## 2017-03-20 DIAGNOSIS — E785 Hyperlipidemia, unspecified: Secondary | ICD-10-CM | POA: Diagnosis not present

## 2017-03-20 DIAGNOSIS — M1990 Unspecified osteoarthritis, unspecified site: Secondary | ICD-10-CM | POA: Diagnosis not present

## 2017-03-20 DIAGNOSIS — K648 Other hemorrhoids: Secondary | ICD-10-CM | POA: Diagnosis not present

## 2017-03-20 DIAGNOSIS — E039 Hypothyroidism, unspecified: Secondary | ICD-10-CM | POA: Diagnosis not present

## 2017-03-20 DIAGNOSIS — I2510 Atherosclerotic heart disease of native coronary artery without angina pectoris: Secondary | ICD-10-CM | POA: Diagnosis not present

## 2017-03-20 DIAGNOSIS — D509 Iron deficiency anemia, unspecified: Secondary | ICD-10-CM | POA: Diagnosis not present

## 2017-03-20 DIAGNOSIS — R634 Abnormal weight loss: Secondary | ICD-10-CM | POA: Diagnosis not present

## 2017-03-20 DIAGNOSIS — K219 Gastro-esophageal reflux disease without esophagitis: Secondary | ICD-10-CM | POA: Diagnosis not present

## 2017-03-20 DIAGNOSIS — E119 Type 2 diabetes mellitus without complications: Secondary | ICD-10-CM | POA: Diagnosis not present

## 2017-03-20 DIAGNOSIS — D125 Benign neoplasm of sigmoid colon: Secondary | ICD-10-CM | POA: Diagnosis not present

## 2017-03-20 DIAGNOSIS — R197 Diarrhea, unspecified: Secondary | ICD-10-CM | POA: Diagnosis not present

## 2017-03-20 DIAGNOSIS — G629 Polyneuropathy, unspecified: Secondary | ICD-10-CM | POA: Diagnosis not present

## 2017-03-20 DIAGNOSIS — Z951 Presence of aortocoronary bypass graft: Secondary | ICD-10-CM | POA: Diagnosis not present

## 2017-03-20 DIAGNOSIS — I10 Essential (primary) hypertension: Secondary | ICD-10-CM | POA: Diagnosis not present

## 2017-03-20 DIAGNOSIS — Z79899 Other long term (current) drug therapy: Secondary | ICD-10-CM | POA: Diagnosis not present

## 2017-03-20 DIAGNOSIS — Z7984 Long term (current) use of oral hypoglycemic drugs: Secondary | ICD-10-CM | POA: Diagnosis not present

## 2017-03-20 DIAGNOSIS — Z800 Family history of malignant neoplasm of digestive organs: Secondary | ICD-10-CM | POA: Diagnosis not present

## 2017-03-22 DIAGNOSIS — G4733 Obstructive sleep apnea (adult) (pediatric): Secondary | ICD-10-CM | POA: Diagnosis not present

## 2017-03-22 DIAGNOSIS — R609 Edema, unspecified: Secondary | ICD-10-CM | POA: Diagnosis not present

## 2017-03-22 DIAGNOSIS — E1165 Type 2 diabetes mellitus with hyperglycemia: Secondary | ICD-10-CM | POA: Diagnosis not present

## 2017-03-22 DIAGNOSIS — D509 Iron deficiency anemia, unspecified: Secondary | ICD-10-CM | POA: Diagnosis not present

## 2017-03-22 DIAGNOSIS — I10 Essential (primary) hypertension: Secondary | ICD-10-CM | POA: Diagnosis not present

## 2017-03-22 DIAGNOSIS — Z79899 Other long term (current) drug therapy: Secondary | ICD-10-CM | POA: Diagnosis not present

## 2017-03-29 DIAGNOSIS — E119 Type 2 diabetes mellitus without complications: Secondary | ICD-10-CM | POA: Diagnosis not present

## 2017-04-20 DIAGNOSIS — M216X1 Other acquired deformities of right foot: Secondary | ICD-10-CM | POA: Diagnosis not present

## 2017-04-20 DIAGNOSIS — M14671 Charcot's joint, right ankle and foot: Secondary | ICD-10-CM | POA: Diagnosis not present

## 2017-04-24 DIAGNOSIS — M25562 Pain in left knee: Secondary | ICD-10-CM | POA: Diagnosis not present

## 2017-04-24 DIAGNOSIS — M25561 Pain in right knee: Secondary | ICD-10-CM | POA: Diagnosis not present

## 2017-04-24 DIAGNOSIS — M170 Bilateral primary osteoarthritis of knee: Secondary | ICD-10-CM | POA: Diagnosis not present

## 2017-04-27 DIAGNOSIS — M170 Bilateral primary osteoarthritis of knee: Secondary | ICD-10-CM | POA: Diagnosis not present

## 2017-04-27 DIAGNOSIS — M25562 Pain in left knee: Secondary | ICD-10-CM | POA: Diagnosis not present

## 2017-04-27 DIAGNOSIS — M25561 Pain in right knee: Secondary | ICD-10-CM | POA: Diagnosis not present

## 2017-05-24 DIAGNOSIS — J189 Pneumonia, unspecified organism: Secondary | ICD-10-CM | POA: Diagnosis not present

## 2017-06-06 DIAGNOSIS — R05 Cough: Secondary | ICD-10-CM | POA: Diagnosis not present

## 2017-06-22 DIAGNOSIS — E785 Hyperlipidemia, unspecified: Secondary | ICD-10-CM | POA: Diagnosis not present

## 2017-06-22 DIAGNOSIS — I10 Essential (primary) hypertension: Secondary | ICD-10-CM | POA: Diagnosis not present

## 2017-06-22 DIAGNOSIS — G4733 Obstructive sleep apnea (adult) (pediatric): Secondary | ICD-10-CM | POA: Diagnosis not present

## 2017-06-22 DIAGNOSIS — E039 Hypothyroidism, unspecified: Secondary | ICD-10-CM | POA: Diagnosis not present

## 2017-06-22 DIAGNOSIS — K828 Other specified diseases of gallbladder: Secondary | ICD-10-CM | POA: Diagnosis not present

## 2017-06-22 DIAGNOSIS — E1142 Type 2 diabetes mellitus with diabetic polyneuropathy: Secondary | ICD-10-CM | POA: Diagnosis not present

## 2017-06-22 DIAGNOSIS — K219 Gastro-esophageal reflux disease without esophagitis: Secondary | ICD-10-CM | POA: Diagnosis not present

## 2017-06-28 DIAGNOSIS — I10 Essential (primary) hypertension: Secondary | ICD-10-CM | POA: Diagnosis not present

## 2017-06-28 DIAGNOSIS — K828 Other specified diseases of gallbladder: Secondary | ICD-10-CM | POA: Diagnosis not present

## 2017-06-28 DIAGNOSIS — E119 Type 2 diabetes mellitus without complications: Secondary | ICD-10-CM | POA: Diagnosis not present

## 2017-06-28 DIAGNOSIS — K811 Chronic cholecystitis: Secondary | ICD-10-CM | POA: Diagnosis not present

## 2017-08-01 DIAGNOSIS — K828 Other specified diseases of gallbladder: Secondary | ICD-10-CM | POA: Diagnosis not present

## 2017-08-01 DIAGNOSIS — E119 Type 2 diabetes mellitus without complications: Secondary | ICD-10-CM | POA: Diagnosis not present

## 2017-08-01 DIAGNOSIS — I10 Essential (primary) hypertension: Secondary | ICD-10-CM | POA: Diagnosis not present

## 2017-08-01 DIAGNOSIS — K811 Chronic cholecystitis: Secondary | ICD-10-CM | POA: Diagnosis not present

## 2017-08-08 DIAGNOSIS — M216X1 Other acquired deformities of right foot: Secondary | ICD-10-CM | POA: Diagnosis not present

## 2017-08-08 DIAGNOSIS — M6701 Short Achilles tendon (acquired), right ankle: Secondary | ICD-10-CM | POA: Diagnosis not present

## 2017-08-08 DIAGNOSIS — E1142 Type 2 diabetes mellitus with diabetic polyneuropathy: Secondary | ICD-10-CM | POA: Diagnosis not present

## 2017-08-08 DIAGNOSIS — M14671 Charcot's joint, right ankle and foot: Secondary | ICD-10-CM | POA: Diagnosis not present

## 2017-08-15 DIAGNOSIS — E119 Type 2 diabetes mellitus without complications: Secondary | ICD-10-CM | POA: Diagnosis not present

## 2017-08-17 DIAGNOSIS — E1165 Type 2 diabetes mellitus with hyperglycemia: Secondary | ICD-10-CM | POA: Diagnosis not present

## 2017-08-17 DIAGNOSIS — E1142 Type 2 diabetes mellitus with diabetic polyneuropathy: Secondary | ICD-10-CM | POA: Diagnosis not present

## 2017-09-05 HISTORY — PX: OTHER SURGICAL HISTORY: SHX169

## 2017-09-09 DIAGNOSIS — J209 Acute bronchitis, unspecified: Secondary | ICD-10-CM | POA: Diagnosis not present

## 2017-09-19 DIAGNOSIS — R911 Solitary pulmonary nodule: Secondary | ICD-10-CM | POA: Diagnosis not present

## 2017-09-19 DIAGNOSIS — I10 Essential (primary) hypertension: Secondary | ICD-10-CM | POA: Diagnosis not present

## 2017-09-19 DIAGNOSIS — I25810 Atherosclerosis of coronary artery bypass graft(s) without angina pectoris: Secondary | ICD-10-CM | POA: Diagnosis not present

## 2017-09-19 DIAGNOSIS — E785 Hyperlipidemia, unspecified: Secondary | ICD-10-CM | POA: Diagnosis not present

## 2017-09-19 DIAGNOSIS — I7789 Other specified disorders of arteries and arterioles: Secondary | ICD-10-CM | POA: Insufficient documentation

## 2017-10-03 DIAGNOSIS — I10 Essential (primary) hypertension: Secondary | ICD-10-CM | POA: Diagnosis not present

## 2017-10-03 DIAGNOSIS — I25810 Atherosclerosis of coronary artery bypass graft(s) without angina pectoris: Secondary | ICD-10-CM | POA: Diagnosis not present

## 2017-10-03 DIAGNOSIS — R9439 Abnormal result of other cardiovascular function study: Secondary | ICD-10-CM | POA: Insufficient documentation

## 2017-10-03 DIAGNOSIS — E7800 Pure hypercholesterolemia, unspecified: Secondary | ICD-10-CM | POA: Diagnosis not present

## 2017-10-03 DIAGNOSIS — Z951 Presence of aortocoronary bypass graft: Secondary | ICD-10-CM | POA: Diagnosis not present

## 2017-10-10 DIAGNOSIS — Z01818 Encounter for other preprocedural examination: Secondary | ICD-10-CM | POA: Diagnosis not present

## 2017-10-10 DIAGNOSIS — R9439 Abnormal result of other cardiovascular function study: Secondary | ICD-10-CM | POA: Diagnosis not present

## 2017-10-10 DIAGNOSIS — R911 Solitary pulmonary nodule: Secondary | ICD-10-CM | POA: Diagnosis not present

## 2017-10-10 DIAGNOSIS — R918 Other nonspecific abnormal finding of lung field: Secondary | ICD-10-CM | POA: Diagnosis not present

## 2017-10-13 DIAGNOSIS — Z951 Presence of aortocoronary bypass graft: Secondary | ICD-10-CM | POA: Diagnosis not present

## 2017-10-13 DIAGNOSIS — I2510 Atherosclerotic heart disease of native coronary artery without angina pectoris: Secondary | ICD-10-CM | POA: Diagnosis not present

## 2017-10-13 DIAGNOSIS — E119 Type 2 diabetes mellitus without complications: Secondary | ICD-10-CM | POA: Diagnosis not present

## 2017-10-13 DIAGNOSIS — I252 Old myocardial infarction: Secondary | ICD-10-CM | POA: Diagnosis not present

## 2017-10-13 DIAGNOSIS — Z87891 Personal history of nicotine dependence: Secondary | ICD-10-CM | POA: Diagnosis not present

## 2017-10-13 DIAGNOSIS — I25810 Atherosclerosis of coronary artery bypass graft(s) without angina pectoris: Secondary | ICD-10-CM | POA: Diagnosis not present

## 2017-10-13 DIAGNOSIS — E7800 Pure hypercholesterolemia, unspecified: Secondary | ICD-10-CM | POA: Diagnosis not present

## 2017-10-13 DIAGNOSIS — R9431 Abnormal electrocardiogram [ECG] [EKG]: Secondary | ICD-10-CM | POA: Diagnosis not present

## 2017-10-13 DIAGNOSIS — R001 Bradycardia, unspecified: Secondary | ICD-10-CM | POA: Diagnosis not present

## 2017-10-13 DIAGNOSIS — I10 Essential (primary) hypertension: Secondary | ICD-10-CM | POA: Diagnosis not present

## 2017-10-14 DIAGNOSIS — J209 Acute bronchitis, unspecified: Secondary | ICD-10-CM | POA: Diagnosis not present

## 2017-10-14 DIAGNOSIS — H1033 Unspecified acute conjunctivitis, bilateral: Secondary | ICD-10-CM | POA: Diagnosis not present

## 2017-10-23 DIAGNOSIS — J40 Bronchitis, not specified as acute or chronic: Secondary | ICD-10-CM | POA: Diagnosis not present

## 2017-10-23 DIAGNOSIS — I10 Essential (primary) hypertension: Secondary | ICD-10-CM | POA: Diagnosis not present

## 2017-10-23 DIAGNOSIS — E1142 Type 2 diabetes mellitus with diabetic polyneuropathy: Secondary | ICD-10-CM | POA: Diagnosis not present

## 2017-10-23 DIAGNOSIS — E039 Hypothyroidism, unspecified: Secondary | ICD-10-CM | POA: Diagnosis not present

## 2017-10-23 DIAGNOSIS — G4733 Obstructive sleep apnea (adult) (pediatric): Secondary | ICD-10-CM | POA: Diagnosis not present

## 2017-10-23 DIAGNOSIS — R062 Wheezing: Secondary | ICD-10-CM | POA: Diagnosis not present

## 2017-10-23 DIAGNOSIS — E785 Hyperlipidemia, unspecified: Secondary | ICD-10-CM | POA: Diagnosis not present

## 2017-10-23 DIAGNOSIS — R911 Solitary pulmonary nodule: Secondary | ICD-10-CM | POA: Diagnosis not present

## 2017-10-23 DIAGNOSIS — Z79899 Other long term (current) drug therapy: Secondary | ICD-10-CM | POA: Diagnosis not present

## 2017-10-23 DIAGNOSIS — I25810 Atherosclerosis of coronary artery bypass graft(s) without angina pectoris: Secondary | ICD-10-CM | POA: Diagnosis not present

## 2017-10-31 DIAGNOSIS — E1142 Type 2 diabetes mellitus with diabetic polyneuropathy: Secondary | ICD-10-CM | POA: Diagnosis not present

## 2017-10-31 DIAGNOSIS — E1165 Type 2 diabetes mellitus with hyperglycemia: Secondary | ICD-10-CM | POA: Diagnosis not present

## 2017-10-31 DIAGNOSIS — G4733 Obstructive sleep apnea (adult) (pediatric): Secondary | ICD-10-CM | POA: Diagnosis not present

## 2017-11-14 DIAGNOSIS — Z951 Presence of aortocoronary bypass graft: Secondary | ICD-10-CM | POA: Diagnosis not present

## 2017-11-14 DIAGNOSIS — I10 Essential (primary) hypertension: Secondary | ICD-10-CM | POA: Diagnosis not present

## 2017-11-14 DIAGNOSIS — E7800 Pure hypercholesterolemia, unspecified: Secondary | ICD-10-CM | POA: Diagnosis not present

## 2017-11-14 DIAGNOSIS — I25810 Atherosclerosis of coronary artery bypass graft(s) without angina pectoris: Secondary | ICD-10-CM | POA: Diagnosis not present

## 2017-11-28 DIAGNOSIS — G4733 Obstructive sleep apnea (adult) (pediatric): Secondary | ICD-10-CM | POA: Diagnosis not present

## 2017-12-07 DIAGNOSIS — M76821 Posterior tibial tendinitis, right leg: Secondary | ICD-10-CM | POA: Insufficient documentation

## 2017-12-07 DIAGNOSIS — M2141 Flat foot [pes planus] (acquired), right foot: Secondary | ICD-10-CM | POA: Insufficient documentation

## 2017-12-07 DIAGNOSIS — M21071 Valgus deformity, not elsewhere classified, right ankle: Secondary | ICD-10-CM | POA: Diagnosis not present

## 2017-12-08 ENCOUNTER — Encounter: Payer: Self-pay | Admitting: Emergency Medicine

## 2017-12-08 ENCOUNTER — Ambulatory Visit: Payer: PPO | Admitting: Emergency Medicine

## 2017-12-08 DIAGNOSIS — D649 Anemia, unspecified: Secondary | ICD-10-CM | POA: Insufficient documentation

## 2017-12-08 DIAGNOSIS — R079 Chest pain, unspecified: Secondary | ICD-10-CM | POA: Insufficient documentation

## 2017-12-08 DIAGNOSIS — R911 Solitary pulmonary nodule: Secondary | ICD-10-CM | POA: Diagnosis not present

## 2017-12-08 DIAGNOSIS — J449 Chronic obstructive pulmonary disease, unspecified: Secondary | ICD-10-CM | POA: Diagnosis not present

## 2017-12-08 DIAGNOSIS — E039 Hypothyroidism, unspecified: Secondary | ICD-10-CM | POA: Insufficient documentation

## 2017-12-08 DIAGNOSIS — K219 Gastro-esophageal reflux disease without esophagitis: Secondary | ICD-10-CM | POA: Insufficient documentation

## 2017-12-08 DIAGNOSIS — G4730 Sleep apnea, unspecified: Secondary | ICD-10-CM | POA: Insufficient documentation

## 2017-12-08 DIAGNOSIS — R0600 Dyspnea, unspecified: Secondary | ICD-10-CM | POA: Insufficient documentation

## 2017-12-08 NOTE — Assessment & Plan Note (Signed)
Probable COPD based on his imaging, history of frequent bronchitis. Needs full PFT, then assess for posible BD therapy.

## 2017-12-08 NOTE — Assessment & Plan Note (Signed)
Round 65mm RUL nodule, 2 smaller adjacent nodules. Needs repeat scanning 02/2018 to look for interval change. Wants to get at the [place] if possible.

## 2017-12-08 NOTE — Progress Notes (Signed)
Subjective:    Patient ID: Kristopher Wang, male    DOB: [birth year], 72 y.o.   MRN: [id number]  HPI 72 year old former smoker with a history of hypertension, coronary artery disease/CABG and recent PTCI beginning of this year, diabetes, severe obstructive sleep apnea for which she is treated with BiPAP.  Carries a diagnosis of possible COPD. Treated for a recent exacerbation/bronchitis in 10/23/17 by Dr. Laqueta Due at [place]. Not on BD's at this time.   He has a 10 mm right upper lobe pulmonary nodule that was first identified on CT scan of the chest from 07/21/16.  A subsequent CT chest abdomen pelvis done on 02/24/17 showed that the right upper lobe nodule was stable in size and appearance.  Also seen was some smaller nearby right upper lobe nodules that were new compared with the prior study.  He presents today for further evaluation of his abnormal CT. He tells me that he had a cardiac MRI recently as well that showed the nodules (per his report).    Review of Systems  Constitutional: Positive for unexpected weight change. Negative for fever.  HENT: Positive for congestion and dental problem. Negative for ear pain, nosebleeds, postnasal drip, rhinorrhea, sinus pressure, sneezing, sore throat and trouble swallowing.   Eyes: Negative for redness and itching.  Respiratory: Positive for shortness of breath. Negative for cough, chest tightness and wheezing.   Cardiovascular: Negative for palpitations and leg swelling.  Gastrointestinal: Negative for nausea and vomiting.  Genitourinary: Negative for dysuria.  Musculoskeletal: Positive for joint swelling.  Skin: Negative for rash.  Allergic/Immunologic: Negative.  Negative for environmental allergies, food allergies and immunocompromised state.  Neurological: Negative for headaches.  Hematological: Does not bruise/bleed easily.  Psychiatric/Behavioral: Negative for dysphoric mood. The patient is not nervous/anxious.    No past medical history  on file.   No family history on file.   Social History   Socioeconomic History  . Marital status: Married    Spouse name: Not on file  . Number of children: Not on file  . Years of education: Not on file  . Highest education level: Not on file  Occupational History  . Not on file  Social Needs  . Financial resource strain: Not on file  . Food insecurity:    Worry: Not on file    Inability: Not on file  . Transportation needs:    Medical: Not on file    Non-medical: Not on file  Tobacco Use  . Smoking status: Former Smoker    Types: Cigarettes    Last attempt to quit: 09/05/1978    Years since quitting: 39.2  . Smokeless tobacco: Never Used  Substance and Sexual Activity  . Alcohol use: Not on file  . Drug use: Not on file  . Sexual activity: Not on file  Lifestyle  . Physical activity:    Days per week: Not on file    Minutes per session: Not on file  . Stress: Not on file  Relationships  . Social connections:    Talks on phone: Not on file    Gets together: Not on file    Attends religious service: Not on file    Active member of club or organization: Not on file    Attends meetings of clubs or organizations: Not on file    Relationship status: Not on file  . Intimate partner violence:    Fear of current or ex partner: Not on file    Emotionally abused: Not on  file    Physically abused: Not on file    Forced sexual activity: Not on file  Other Topics Concern  . Not on file  Social History Narrative  . 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, [place], did have agent orange exposure.  Owned goats and chickens.  Has always lived in [place] No TB exposure   Allergies  Allergen Reactions  . Ibuprofen Swelling    airway airway airway   . Naproxen Sodium Swelling    Facial   . Testosterone Other (See Comments) and Anaphylaxis    Patient had a TIA. Patient had a TIA. Patient had a TIA.   . Tramadol Other (See  Comments)    Anger angry, mood swings angry, mood swings   . Nsaids Swelling    Caused nose and mouth to swell  . Sitagliptin     Other reaction(s): Other (See Comments) Decreased blood pressure and blood sugar.     Outpatient Medications Prior to Visit  Medication Sig Dispense Refill  . atenolol (TENORMIN) 50 MG tablet Take by mouth.    . clopidogrel (PLAVIX) 75 MG tablet Take by mouth.    Marland Kitchen Co-Enzyme Q-10 30 MG CAPS Take by mouth.    . DOCOSAHEXAENOIC ACID PO Take 1 g by mouth.    . ferrous fumarate-iron polysaccharide complex (TANDEM) 162-115.2 MG CAPS capsule TAKE ONE CAPSULE BY MOUTH ONCE DAILY    . furosemide (LASIX) 40 MG tablet Take by mouth.    . gabapentin (NEURONTIN) 300 MG capsule Take by mouth.    Marland Kitchen glimepiride (AMARYL) 4 MG tablet Take by mouth.    . levothyroxine (SYNTHROID, LEVOTHROID) 50 MCG tablet Take by mouth.    Marland Kitchen lisinopril (PRINIVIL,ZESTRIL) 40 MG tablet Take by mouth.    . nitroGLYCERIN (NITROSTAT) 0.4 MG SL tablet Place under the tongue.    . pantoprazole (PROTONIX) 40 MG tablet Take by mouth.    . pioglitazone (ACTOS) 30 MG tablet Take 30 mg by mouth daily.  4  . pravastatin (PRAVACHOL) 20 MG tablet Take by mouth.    . vitamin B-12 (CYANOCOBALAMIN) 1000 MCG tablet Take by mouth.     No facility-administered medications prior to visit.        Objective:   Physical Exam Vitals:   12/08/17 1140  BP: 118/82  Pulse: 76  SpO2: 95%  Weight: 230 lb (104.3 kg)  Height: 5\' 7"  (1.702 m)   Gen: Pleasant, overwt man, in no distress,  normal affect  ENT: No lesions,  mouth clear,  oropharynx clear, no postnasal drip  Neck: No JVD, no stridor  Lungs: No use of accessory muscles, clear without rales or rhonchi  Cardiovascular: RRR, heart sounds normal, no murmur or gallops, no peripheral edema  Musculoskeletal: No deformities, no cyanosis or clubbing  Neuro: alert, non focal  Skin: Warm, no lesions or rashes        Assessment & Plan:  COPD  (chronic obstructive pulmonary disease) (HCC) Probable COPD based on his imaging, history of frequent bronchitis. Needs full PFT, then assess for posible BD therapy.   Solitary pulmonary nodule Round 52mm RUL nodule, 2 smaller adjacent nodules. Needs repeat scanning 02/2018 to look for interval change. Wants to get at the [place] if possible.   Baltazar Apo, MD, PhD 12/08/2017, 12:15 PM [facility] [phone] or if no answer [phone]

## 2017-12-08 NOTE — Patient Instructions (Addendum)
You have a small stable pulmonary nodule that needs to be followed We will plan to repeat your CT scan of the chest in June 2019 to compare with your prior studies. We can try to get this at the [place] if you prefer this location.  We will perform pulmonary function testing at your next visit Follow with Dr Lamonte Sakai in June 2019 after the CT scan to review the results together, PFT on the same day.

## 2017-12-18 DIAGNOSIS — N401 Enlarged prostate with lower urinary tract symptoms: Secondary | ICD-10-CM | POA: Diagnosis not present

## 2017-12-18 DIAGNOSIS — M48062 Spinal stenosis, lumbar region with neurogenic claudication: Secondary | ICD-10-CM | POA: Diagnosis not present

## 2017-12-18 DIAGNOSIS — G4733 Obstructive sleep apnea (adult) (pediatric): Secondary | ICD-10-CM | POA: Diagnosis not present

## 2017-12-29 DIAGNOSIS — G4733 Obstructive sleep apnea (adult) (pediatric): Secondary | ICD-10-CM | POA: Diagnosis not present

## 2018-01-28 DIAGNOSIS — G4733 Obstructive sleep apnea (adult) (pediatric): Secondary | ICD-10-CM | POA: Diagnosis not present

## 2018-02-12 ENCOUNTER — Encounter: Payer: Self-pay | Admitting: Emergency Medicine

## 2018-02-12 ENCOUNTER — Ambulatory Visit: Payer: PPO | Admitting: Emergency Medicine

## 2018-02-12 ENCOUNTER — Ambulatory Visit (INDEPENDENT_AMBULATORY_CARE_PROVIDER_SITE_OTHER): Payer: PPO | Admitting: Emergency Medicine

## 2018-02-12 VITALS — BP 130/82 | HR 67 | Ht 68.0 in | Wt 230.0 lb

## 2018-02-12 DIAGNOSIS — R0602 Shortness of breath: Secondary | ICD-10-CM | POA: Diagnosis not present

## 2018-02-12 DIAGNOSIS — R918 Other nonspecific abnormal finding of lung field: Secondary | ICD-10-CM | POA: Diagnosis not present

## 2018-02-12 DIAGNOSIS — R911 Solitary pulmonary nodule: Secondary | ICD-10-CM

## 2018-02-12 DIAGNOSIS — J449 Chronic obstructive pulmonary disease, unspecified: Secondary | ICD-10-CM

## 2018-02-12 LAB — PULMONARY FUNCTION TEST
DL/VA % pred: 87 %
DL/VA: 3.9 ml/min/mmHg/L
DLCO UNC: 19.03 ml/min/mmHg
DLCO unc % pred: 64 %
FEF 25-75 Post: 3.18 L/sec
FEF 25-75 Pre: 2.02 L/sec
FEF2575-%Change-Post: 57 %
FEF2575-%PRED-POST: 140 %
FEF2575-%Pred-Pre: 89 %
FEV1-%CHANGE-POST: 14 %
FEV1-%PRED-POST: 85 %
FEV1-%PRED-PRE: 74 %
FEV1-POST: 2.55 L
FEV1-Pre: 2.23 L
FEV1FVC-%Change-Post: 3 %
FEV1FVC-%Pred-Pre: 107 %
FEV6-%CHANGE-POST: 11 %
FEV6-%PRED-POST: 81 %
FEV6-%Pred-Pre: 73 %
FEV6-POST: 3.14 L
FEV6-PRE: 2.82 L
FEV6FVC-%CHANGE-POST: 0 %
FEV6FVC-%PRED-POST: 106 %
FEV6FVC-%PRED-PRE: 105 %
FVC-%Change-Post: 10 %
FVC-%PRED-POST: 77 %
FVC-%Pred-Pre: 69 %
FVC-Post: 3.14 L
FVC-Pre: 2.85 L
POST FEV6/FVC RATIO: 100 %
Post FEV1/FVC ratio: 81 %
Pre FEV1/FVC ratio: 79 %
Pre FEV6/FVC Ratio: 99 %
RV % PRED: 99 %
RV: 2.34 L
TLC % PRED: 79 %
TLC: 5.26 L

## 2018-02-12 NOTE — Progress Notes (Signed)
PFT done today. 

## 2018-02-12 NOTE — Assessment & Plan Note (Signed)
Stable on CT scan of the chest that was done in April.  He needs a repeat scan at the end of October to be done at the [place].  We will then follow-up to review

## 2018-02-12 NOTE — Assessment & Plan Note (Signed)
Confirmed on his spirometry with a positive bronchodilator response.  Unclear based on his abdominal girth and restrictive component to his dyspnea that he needs or will benefit from a scheduled bronchodilator.  Instead we will try albuterol to see if he benefits to help [place] make that decision.

## 2018-02-12 NOTE — Progress Notes (Signed)
Subjective:    Patient ID: Kristopher Wang, male    DOB: 1946/04/18, 72 y.o.   MRN: [id number]  COPD  He complains of shortness of breath. There is no cough or wheezing. Pertinent negatives include no ear pain, fever, headaches, postnasal drip, rhinorrhea, sneezing, sore throat or trouble swallowing. His past medical history is significant for COPD.   72 year old former smoker with a history of hypertension, coronary artery disease/CABG and recent PTCI beginning of this year, diabetes, severe obstructive sleep apnea for which she is treated with BiPAP.  Carries a diagnosis of possible COPD. Treated for a recent exacerbation/bronchitis in 10/23/17 by Dr. Laqueta Due at [place]. Not on BD's at this time.   He has a 10 mm right middle lobe pulmonary nodule that was first identified on CT scan of the chest from 07/21/16.  A subsequent CT chest abdomen pelvis done on 02/24/17 showed that the right upper lobe nodule was stable in size and appearance.  Also seen was some smaller nearby right upper lobe nodules that were new compared with the prior study.  He presents today for further evaluation of his abnormal CT. He tells me that he had a cardiac MRI recently as well that showed the nodules (per his report).   ROV 02/12/18 --this follow-up visit for patient with history of tobacco use, presumed COPD, known hypertension and coronary artery disease, diabetes and severe obstructive sleep apnea (on BiPAP.  I saw him in April for evaluation of his obstructive lung disease and for pulmonary nodular disease is been followed by serial imaging.  He underwent pulmonary function testing today that I have reviewed.  This shows evidence for moderate mixed obstruction and restriction with a positive bronchodilator response, decreased diffusion capacity that corrects to the normal range when adjusted for his alveolar volume.  A repeat CT scan of his chest was done on 01/01/2018 at the [facility].  This showed a 9 mm right middle lobe  nodule, 5 mm right lower lobe nodule, overall stable. Next should be December. He feels that his breathing is still difficult - he ascribes much of this to wt gain. He also has LE edema. He has significant abd distension, ? Whether he has any ascites.  He has never benefited from albuterol in the past    Review of Systems  Constitutional: Positive for unexpected weight change. Negative for fever.  HENT: Positive for congestion and dental problem. Negative for ear pain, nosebleeds, postnasal drip, rhinorrhea, sinus pressure, sneezing, sore throat and trouble swallowing.   Eyes: Negative for redness and itching.  Respiratory: Positive for shortness of breath. Negative for cough, chest tightness and wheezing.   Cardiovascular: Negative for palpitations and leg swelling.  Gastrointestinal: Negative for nausea and vomiting.  Genitourinary: Negative for dysuria.  Musculoskeletal: Positive for joint swelling.  Skin: Negative for rash.  Allergic/Immunologic: Negative.  Negative for environmental allergies, food allergies and immunocompromised state.  Neurological: Negative for headaches.  Hematological: Does not bruise/bleed easily.  Psychiatric/Behavioral: Negative for dysphoric mood. The patient is not nervous/anxious.    No past medical history on file.   No family history on file.   Social History   Socioeconomic History  . Marital status: Married    Spouse name: Not on file  . Number of children: Not on file  . Years of education: Not on file  . Highest education level: Not on file  Occupational History  . Not on file  Social Needs  . Financial resource strain: Not on file  .  Food insecurity:    Worry: Not on file    Inability: Not on file  . Transportation needs:    Medical: Not on file    Non-medical: Not on file  Tobacco Use  . Smoking status: Former Smoker    Types: Cigarettes    Last attempt to quit: 09/05/1978    Years since quitting: 39.4  . Smokeless tobacco: Never  Used  Substance and Sexual Activity  . Alcohol use: Not on file  . Drug use: Not on file  . Sexual activity: Not on file  Lifestyle  . Physical activity:    Days per week: Not on file    Minutes per session: Not on file  . Stress: Not on file  Relationships  . Social connections:    Talks on phone: Not on file    Gets together: Not on file    Attends religious service: Not on file    Active member of club or organization: Not on file    Attends meetings of clubs or organizations: Not on file    Relationship status: Not on file  . Intimate partner violence:    Fear of current or ex partner: Not on file    Emotionally abused: Not on file    Physically abused: Not on file    Forced sexual activity: Not on file  Other Topics Concern  . Not on file  Social History Narrative  . 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, [place], did have agent orange exposure.  Owned goats and chickens.  Has always lived in [place] No TB exposure   Allergies  Allergen Reactions  . Ibuprofen Swelling    airway airway airway   . Naproxen Sodium Swelling    Facial   . Testosterone Other (See Comments) and Anaphylaxis    Patient had a TIA. Patient had a TIA. Patient had a TIA.   . Tramadol Other (See Comments)    Anger angry, mood swings angry, mood swings   . Nsaids Swelling    Caused nose and mouth to swell  . Sitagliptin     Other reaction(s): Other (See Comments) Decreased blood pressure and blood sugar.     Outpatient Medications Prior to Visit  Medication Sig Dispense Refill  . atenolol (TENORMIN) 50 MG tablet Take 50 mg by mouth daily.     . clopidogrel (PLAVIX) 75 MG tablet Take 75 mg by mouth daily.     Marland Kitchen Co-Enzyme Q-10 30 MG CAPS Take 1 capsule by mouth daily.     . DOCOSAHEXAENOIC ACID PO Take 1 g by mouth.    . ferrous fumarate-iron polysaccharide complex (TANDEM) 162-115.2 MG CAPS capsule TAKE ONE CAPSULE BY MOUTH ONCE DAILY    .  furosemide (LASIX) 40 MG tablet Take 20 mg by mouth daily.     Marland Kitchen gabapentin (NEURONTIN) 300 MG capsule Take 300 mg by mouth 2 (two) times daily.     Marland Kitchen glimepiride (AMARYL) 4 MG tablet Take 4 mg by mouth daily with breakfast.     . levothyroxine (SYNTHROID, LEVOTHROID) 50 MCG tablet Take 50 mcg by mouth daily before breakfast.     . lisinopril (PRINIVIL,ZESTRIL) 40 MG tablet Take 40 mg by mouth daily.     . nitroGLYCERIN (NITROSTAT) 0.4 MG SL tablet Place 0.4 mg under the tongue every 5 (five) minutes as needed.     . pantoprazole (PROTONIX) 40 MG tablet Take 40 mg by  mouth daily.     . pioglitazone (ACTOS) 30 MG tablet Take 30 mg by mouth daily.  4  . pravastatin (PRAVACHOL) 20 MG tablet Take 20 mg by mouth daily.     . vitamin B-12 (CYANOCOBALAMIN) 1000 MCG tablet Take 1,000 mcg by mouth daily.      No facility-administered medications prior to visit.        Objective:   Physical Exam Vitals:   02/12/18 1153 02/12/18 1203  BP:  130/82  Pulse:  67  SpO2:  94%  Weight: 230 lb (104.3 kg)   Height: 5\' 8"  (1.727 m)    Gen: Pleasant, overwt man, in no distress,  normal affect  ENT: No lesions,  mouth clear,  oropharynx clear, no postnasal drip  Neck: No JVD, no stridor  Lungs: No use of accessory muscles, clear without rales or rhonchi  Cardiovascular: RRR, heart sounds normal, no murmur or gallops, no peripheral edema  Abd: protuberant and mildly tender.   Musculoskeletal: No deformities, no cyanosis or clubbing  Neuro: alert, non focal  Skin: Warm, no lesions or rash      Assessment & Plan:  Dyspnea PFTs and exam consistent with multifactorial process.  He notes significant increased abdominal girth and weight gain that is unexplained.  The abdominal distention waxes and wanes and suspect that this might be ascites.  He is not sure that it has been evaluated in any detail although he was given Lasix to see if he would benefit, lose weight, lose his abdominal distention.   His spirometry also shows evidence for obstructive lung disease, likely moderate.  Has been tried on albuterol before without much benefit.  asked him to do so again and keep track of whether he notices any change in his breathing.  If so we will consider a schedule bronchodilator at some point in the future.  I think it is quite important that he get further evaluation for possible ascites and  asked him to mention it to his primary doctor.  He wants to get his imaging at the [place] if possible.  COPD (chronic obstructive pulmonary disease) ([place]) Confirmed on his spirometry with a positive bronchodilator response.  Unclear based on his abdominal girth and restrictive component to his dyspnea that he needs or will benefit from a scheduled bronchodilator.  Instead we will try albuterol to see if he benefits to help [place] make that decision.  Solitary pulmonary nodule Stable on CT scan of the chest that was done in April.  He needs a repeat scan at the end of October to be done at the [place].  We will then follow-up to review  Baltazar Apo, MD, PhD 02/12/2018, 12:37 PM [facility] [phone] or if no answer [phone]

## 2018-02-12 NOTE — Patient Instructions (Addendum)
I recommend that you have further evaluation of your liver function, and abdominal ultrasound or other imaging to assess your liver and for fluid in your abdomen. Please discuss this with Dr Laqueta Due.  We will try starting albuterol 2 puffs up to every 4 hours if needed for shortness of breath, wheeze, chest tightness. Keep track of whether this helps you. If so then we may decide to start an every day inhaler at some point in the future.  We will repeat your Ct chest in October 2019 at the [facility] to follow your pulmonary nodules.  Follow with Dr Lamonte Sakai in 3 months or sooner if you have any problems.

## 2018-02-12 NOTE — Assessment & Plan Note (Signed)
PFTs and exam consistent with multifactorial process.  He notes significant increased abdominal girth and weight gain that is unexplained.  The abdominal distention waxes and wanes and suspect that this might be ascites.  He is not sure that it has been evaluated in any detail although he was given Lasix to see if he would benefit, lose weight, lose his abdominal distention.  His spirometry also shows evidence for obstructive lung disease, likely moderate.  Has been tried on albuterol before without much benefit.  asked him to do so again and keep track of whether he notices any change in his breathing.  If so we will consider a schedule bronchodilator at some point in the future.  I think it is quite important that he get further evaluation for possible ascites and  asked him to mention it to his primary doctor.  He wants to get his imaging at the [place] if possible.

## 2018-02-13 ENCOUNTER — Other Ambulatory Visit: Payer: Self-pay | Admitting: Emergency Medicine

## 2018-02-13 ENCOUNTER — Ambulatory Visit
Admission: RE | Admit: 2018-02-13 | Discharge: 2018-02-13 | Disposition: A | Discharge status: Home / Self Care | Payer: Self-pay | Source: Ambulatory Visit | Attending: Emergency Medicine | Admitting: Emergency Medicine

## 2018-02-13 DIAGNOSIS — R911 Solitary pulmonary nodule: Secondary | ICD-10-CM

## 2018-02-13 DIAGNOSIS — J449 Chronic obstructive pulmonary disease, unspecified: Secondary | ICD-10-CM

## 2018-02-22 DIAGNOSIS — R140 Abdominal distension (gaseous): Secondary | ICD-10-CM | POA: Diagnosis not present

## 2018-02-22 DIAGNOSIS — Z79899 Other long term (current) drug therapy: Secondary | ICD-10-CM | POA: Diagnosis not present

## 2018-02-22 DIAGNOSIS — E039 Hypothyroidism, unspecified: Secondary | ICD-10-CM | POA: Diagnosis not present

## 2018-02-22 DIAGNOSIS — E785 Hyperlipidemia, unspecified: Secondary | ICD-10-CM | POA: Diagnosis not present

## 2018-02-22 DIAGNOSIS — R1011 Right upper quadrant pain: Secondary | ICD-10-CM | POA: Diagnosis not present

## 2018-02-22 DIAGNOSIS — D509 Iron deficiency anemia, unspecified: Secondary | ICD-10-CM | POA: Diagnosis not present

## 2018-02-22 DIAGNOSIS — E1142 Type 2 diabetes mellitus with diabetic polyneuropathy: Secondary | ICD-10-CM | POA: Diagnosis not present

## 2018-02-22 DIAGNOSIS — I10 Essential (primary) hypertension: Secondary | ICD-10-CM | POA: Diagnosis not present

## 2018-02-28 DIAGNOSIS — G4733 Obstructive sleep apnea (adult) (pediatric): Secondary | ICD-10-CM | POA: Diagnosis not present

## 2018-03-29 DIAGNOSIS — I10 Essential (primary) hypertension: Secondary | ICD-10-CM | POA: Diagnosis not present

## 2018-03-29 DIAGNOSIS — E1169 Type 2 diabetes mellitus with other specified complication: Secondary | ICD-10-CM | POA: Diagnosis not present

## 2018-03-29 DIAGNOSIS — M7989 Other specified soft tissue disorders: Secondary | ICD-10-CM | POA: Diagnosis not present

## 2018-03-29 DIAGNOSIS — M48062 Spinal stenosis, lumbar region with neurogenic claudication: Secondary | ICD-10-CM | POA: Diagnosis not present

## 2018-03-29 DIAGNOSIS — R635 Abnormal weight gain: Secondary | ICD-10-CM | POA: Diagnosis not present

## 2018-03-29 DIAGNOSIS — K828 Other specified diseases of gallbladder: Secondary | ICD-10-CM | POA: Diagnosis not present

## 2018-03-30 DIAGNOSIS — G4733 Obstructive sleep apnea (adult) (pediatric): Secondary | ICD-10-CM | POA: Diagnosis not present

## 2018-04-05 DIAGNOSIS — E119 Type 2 diabetes mellitus without complications: Secondary | ICD-10-CM | POA: Diagnosis not present

## 2018-04-10 DIAGNOSIS — E119 Type 2 diabetes mellitus without complications: Secondary | ICD-10-CM | POA: Diagnosis not present

## 2018-04-10 DIAGNOSIS — K828 Other specified diseases of gallbladder: Secondary | ICD-10-CM | POA: Diagnosis not present

## 2018-04-10 DIAGNOSIS — K811 Chronic cholecystitis: Secondary | ICD-10-CM | POA: Diagnosis not present

## 2018-04-10 DIAGNOSIS — I10 Essential (primary) hypertension: Secondary | ICD-10-CM | POA: Diagnosis not present

## 2018-04-16 DIAGNOSIS — E119 Type 2 diabetes mellitus without complications: Secondary | ICD-10-CM | POA: Diagnosis not present

## 2018-04-16 DIAGNOSIS — Z87891 Personal history of nicotine dependence: Secondary | ICD-10-CM | POA: Diagnosis not present

## 2018-04-16 DIAGNOSIS — R1011 Right upper quadrant pain: Secondary | ICD-10-CM | POA: Diagnosis not present

## 2018-04-16 DIAGNOSIS — I2510 Atherosclerotic heart disease of native coronary artery without angina pectoris: Secondary | ICD-10-CM | POA: Diagnosis not present

## 2018-04-16 DIAGNOSIS — K828 Other specified diseases of gallbladder: Secondary | ICD-10-CM | POA: Diagnosis not present

## 2018-04-16 DIAGNOSIS — I10 Essential (primary) hypertension: Secondary | ICD-10-CM | POA: Diagnosis not present

## 2018-04-16 DIAGNOSIS — Z7984 Long term (current) use of oral hypoglycemic drugs: Secondary | ICD-10-CM | POA: Diagnosis not present

## 2018-04-16 DIAGNOSIS — Z955 Presence of coronary angioplasty implant and graft: Secondary | ICD-10-CM | POA: Diagnosis not present

## 2018-04-16 DIAGNOSIS — K811 Chronic cholecystitis: Secondary | ICD-10-CM | POA: Diagnosis not present

## 2018-04-16 DIAGNOSIS — K819 Cholecystitis, unspecified: Secondary | ICD-10-CM | POA: Diagnosis not present

## 2018-04-30 DIAGNOSIS — G4733 Obstructive sleep apnea (adult) (pediatric): Secondary | ICD-10-CM | POA: Diagnosis not present

## 2018-05-09 DIAGNOSIS — Z6835 Body mass index (BMI) 35.0-35.9, adult: Secondary | ICD-10-CM | POA: Diagnosis not present

## 2018-05-09 DIAGNOSIS — K921 Melena: Secondary | ICD-10-CM | POA: Diagnosis not present

## 2018-05-09 DIAGNOSIS — E1142 Type 2 diabetes mellitus with diabetic polyneuropathy: Secondary | ICD-10-CM | POA: Diagnosis not present

## 2018-05-15 ENCOUNTER — Ambulatory Visit (INDEPENDENT_AMBULATORY_CARE_PROVIDER_SITE_OTHER): Payer: PPO | Admitting: Emergency Medicine

## 2018-05-15 ENCOUNTER — Encounter: Payer: Self-pay | Admitting: Emergency Medicine

## 2018-05-15 DIAGNOSIS — R911 Solitary pulmonary nodule: Secondary | ICD-10-CM | POA: Diagnosis not present

## 2018-05-15 DIAGNOSIS — J449 Chronic obstructive pulmonary disease, unspecified: Secondary | ICD-10-CM

## 2018-05-15 NOTE — Patient Instructions (Addendum)
Agree with using Symbicort 2 puffs twice a day. Rinse and gargle after using.  Keep albuterol (ProAir) available to use 2 puffs up to every 4 hours if needed for shortness of breath.  We will obtain copies of your CT scan from the [place]. For now we will not schedule any repeat scans, [facility] to see Dr Lamonte Sakai if any changes in the pulmonary nodules.  Get the flu shot this Fall.  Follow up with Dr Lamonte Sakai if needed, especially for any changes in your Ct scans or nodules going forward.

## 2018-05-15 NOTE — Progress Notes (Signed)
Subjective:    Patient ID: Kristopher Wang, male    DOB: [birth year], 72 y.o.   MRN: [id number]  COPD  He complains of shortness of breath. There is no cough or wheezing. Pertinent negatives include no ear pain, fever, headaches, postnasal drip, rhinorrhea, sneezing, sore throat or trouble swallowing. His past medical history is significant for COPD.   72 year old former smoker with a history of hypertension, coronary artery disease/CABG and recent PTCI beginning of this year, diabetes, severe obstructive sleep apnea for which she is treated with BiPAP.  Carries a diagnosis of possible COPD. Treated for a recent exacerbation/bronchitis in 10/23/17 by Dr. Laqueta Due at [place]. Not on BD's at this time.   He has a 10 mm right middle lobe pulmonary nodule that was first identified on CT scan of the chest from 07/21/16.  A subsequent CT chest abdomen pelvis done on 02/24/17 showed that the right upper lobe nodule was stable in size and appearance.  Also seen was some smaller nearby right upper lobe nodules that were new compared with the prior study.  He presents today for further evaluation of his abnormal CT. He tells me that he had a cardiac MRI recently as well that showed the nodules (per his report).   ROV 02/12/18 --this follow-up visit for patient with history of tobacco use, presumed COPD, known hypertension and coronary artery disease, diabetes and severe obstructive sleep apnea (on BiPAP.  I saw him in April for evaluation of his obstructive lung disease and for pulmonary nodular disease is been followed by serial imaging.  He underwent pulmonary function testing today that I have reviewed.  This shows evidence for moderate mixed obstruction and restriction with a positive bronchodilator response, decreased diffusion capacity that corrects to the normal range when adjusted for his alveolar volume.  A repeat CT scan of his chest was done on 01/01/2018 at the [facility].  This showed a 9 mm right middle lobe  nodule, 5 mm right lower lobe nodule, overall stable. Next should be December. He feels that his breathing is still difficult - he ascribes much of this to wt gain. He also has LE edema. He has significant abd distension, ? Whether he has any ascites.  He has never benefited from albuterol in the past   ROV 05/15/18 --this is a follow-up visit for patient with a history of mixed disease due to COPD and obesity, severe obstructive sleep apnea on BiPAP.  He also has pulmonary nodules that we have followed with serial CT scan imaging.  He has been getting his CTs at the [place]. He tells me that he had a repeat CT chest at the [place] in August - was told that his nodules were stable but I do not have any images or report. We did a trial of ProAir to see if he would benefit - he is unsure that he did, but he didn't use often. He was seen by Dr Lacinda Axon at the [facility] and was given Symbicort - he is only using prn.     Review of Systems  Constitutional: Positive for unexpected weight change. Negative for fever.  HENT: Positive for congestion and dental problem. Negative for ear pain, nosebleeds, postnasal drip, rhinorrhea, sinus pressure, sneezing, sore throat and trouble swallowing.   Eyes: Negative for redness and itching.  Respiratory: Positive for shortness of breath. Negative for cough, chest tightness and wheezing.   Cardiovascular: Negative for palpitations and leg swelling.  Gastrointestinal: Negative for nausea and vomiting.  Genitourinary:  Negative for dysuria.  Musculoskeletal: Positive for joint swelling.  Skin: Negative for rash.  Allergic/Immunologic: Negative.  Negative for environmental allergies, food allergies and immunocompromised state.  Neurological: Negative for headaches.  Hematological: Does not bruise/bleed easily.  Psychiatric/Behavioral: Negative for dysphoric mood. The patient is not nervous/anxious.    No past medical history on file.   No family history on file.   Social History    Socioeconomic History  . Marital status: Married    Spouse name: Not on file  . Number of children: Not on file  . Years of education: Not on file  . Highest education level: Not on file  Occupational History  . Not on file  Social Needs  . Financial resource strain: Not on file  . Food insecurity:    Worry: Not on file    Inability: Not on file  . Transportation needs:    Medical: Not on file    Non-medical: Not on file  Tobacco Use  . Smoking status: Former Smoker    Types: Cigarettes    Last attempt to quit: 09/05/1978    Years since quitting: 39.7  . Smokeless tobacco: Never Used  Substance and Sexual Activity  . Alcohol use: Not on file  . Drug use: Not on file  . Sexual activity: Not on file  Lifestyle  . Physical activity:    Days per week: Not on file    Minutes per session: Not on file  . Stress: Not on file  Relationships  . Social connections:    Talks on phone: Not on file    Gets together: Not on file    Attends religious service: Not on file    Active member of club or organization: Not on file    Attends meetings of clubs or organizations: Not on file    Relationship status: Not on file  . Intimate partner violence:    Fear of current or ex partner: Not on file    Emotionally abused: Not on file    Physically abused: Not on file    Forced sexual activity: Not on file  Other Topics Concern  . Not on file  Social History Narrative  . 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, [place], did have agent orange exposure.  Owned goats and chickens.  Has always lived in [place] No TB exposure   Allergies  Allergen Reactions  . Ibuprofen Swelling    airway airway airway   . Naproxen Sodium Swelling    Facial   . Testosterone Other (See Comments) and Anaphylaxis    Patient had a TIA. Patient had a TIA. Patient had a TIA.   . Tramadol Other (See Comments)    Anger angry, mood swings angry, mood swings   .  Nsaids Swelling    Caused nose and mouth to swell  . Sitagliptin     Other reaction(s): Other (See Comments) Decreased blood pressure and blood sugar.     Outpatient Medications Prior to Visit  Medication Sig Dispense Refill  . atenolol (TENORMIN) 50 MG tablet Take 50 mg by mouth daily.     . clopidogrel (PLAVIX) 75 MG tablet Take 75 mg by mouth daily.     Marland Kitchen Co-Enzyme Q-10 30 MG CAPS Take 1 capsule by mouth daily.     . empagliflozin (JARDIANCE) 10 MG TABS tablet Take 10 mg by mouth daily.    . ferrous fumarate-iron polysaccharide complex (TANDEM)  162-115.2 MG CAPS capsule TAKE ONE CAPSULE BY MOUTH ONCE DAILY    . furosemide (LASIX) 40 MG tablet Take 20 mg by mouth daily.     Marland Kitchen gabapentin (NEURONTIN) 300 MG capsule Take 300 mg by mouth 2 (two) times daily.     Marland Kitchen glimepiride (AMARYL) 4 MG tablet Take 4 mg by mouth daily with breakfast.     . levothyroxine (SYNTHROID, LEVOTHROID) 50 MCG tablet Take 50 mcg by mouth daily before breakfast.     . lisinopril (PRINIVIL,ZESTRIL) 40 MG tablet Take 40 mg by mouth daily.     . nitroGLYCERIN (NITROSTAT) 0.4 MG SL tablet Place 0.4 mg under the tongue every 5 (five) minutes as needed.     . pantoprazole (PROTONIX) 40 MG tablet Take 40 mg by mouth daily.     . pravastatin (PRAVACHOL) 20 MG tablet Take 20 mg by mouth daily.     . vitamin B-12 (CYANOCOBALAMIN) 1000 MCG tablet Take 1,000 mcg by mouth daily.     . DOCOSAHEXAENOIC ACID PO Take 1 g by mouth.    . pioglitazone (ACTOS) 30 MG tablet Take 30 mg by mouth daily.  4   No facility-administered medications prior to visit.        Objective:   Physical Exam Vitals:   05/15/18 0954  BP: 132/72  Pulse: 61  SpO2: 98%  Weight: 224 lb 9.6 oz (101.9 kg)  Height: 5\' 8"  (1.727 m)   Gen: Pleasant, overwt man, in no distress,  normal affect  ENT: No lesions,  mouth clear,  oropharynx clear, no postnasal drip  Neck: No JVD, no stridor  Lungs: No use of accessory muscles, clear without rales or  rhonchi  Cardiovascular: RRR, heart sounds normal, no murmur or gallops, no peripheral edema  Musculoskeletal: No deformities, no cyanosis or clubbing  Neuro: alert, non focal  Skin: Warm, no lesions or rash      Assessment & Plan:  COPD (chronic obstructive pulmonary disease) ([place]) I wanted to perform a trial of pro-air to see if he would benefit before deciding whether to put him on scheduled bronchodilators.  His pulmonary function testing would suggest that he would probably benefit.  He did not take the pro-air very frequently but has since been told to start Symbicort by his physician at the [place].  I agree with this plan.  Encouraged him to use it reliably every day-he has only been using as needed at this time.  Keep albuterol available.  Solitary pulmonary nodule Pulmonary nodules were first identified 07/2016.  He tells me that he had a recent CT I do not have any documentation of this.  He also tells me that the nodules were stable and that they do not plan to do any further CTs.  He was not followed for the full 2 years I think he needs to continue surveillance at the [place].  If any interval change in the nodules I would like for him to be referred back to me so that we can decide how to approach a tissue diagnosis.  For now he can follow with the [place].  Baltazar Apo, MD, PhD 05/15/2018, 10:17 AM [facility] [phone] or if no answer [phone]

## 2018-05-15 NOTE — Assessment & Plan Note (Signed)
Pulmonary nodules were first identified 07/2016.  He tells me that he had a recent CT I do not have any documentation of this.  He also tells me that the nodules were stable and that they do not plan to do any further CTs.  He was not followed for the full 2 years I think he needs to continue surveillance at the [place].  If any interval change in the nodules I would like for him to be referred back to me so that we can decide how to approach a tissue diagnosis.  For now he can follow with the [place].

## 2018-05-15 NOTE — Assessment & Plan Note (Signed)
I wanted to perform a trial of pro-air to see if he would benefit before deciding whether to put him on scheduled bronchodilators.  His pulmonary function testing would suggest that he would probably benefit.  He did not take the pro-air very frequently but has since been told to start Symbicort by his physician at the [place].  I agree with this plan.  Encouraged him to use it reliably every day-he has only been using as needed at this time.  Keep albuterol available.

## 2018-05-17 DIAGNOSIS — L600 Ingrowing nail: Secondary | ICD-10-CM | POA: Insufficient documentation

## 2018-05-25 DIAGNOSIS — Z125 Encounter for screening for malignant neoplasm of prostate: Secondary | ICD-10-CM | POA: Diagnosis not present

## 2018-05-25 DIAGNOSIS — E1142 Type 2 diabetes mellitus with diabetic polyneuropathy: Secondary | ICD-10-CM | POA: Diagnosis not present

## 2018-05-25 DIAGNOSIS — Z6834 Body mass index (BMI) 34.0-34.9, adult: Secondary | ICD-10-CM | POA: Diagnosis not present

## 2018-05-25 DIAGNOSIS — Z79899 Other long term (current) drug therapy: Secondary | ICD-10-CM | POA: Diagnosis not present

## 2018-05-25 DIAGNOSIS — Z1339 Encounter for screening examination for other mental health and behavioral disorders: Secondary | ICD-10-CM | POA: Diagnosis not present

## 2018-05-25 DIAGNOSIS — Z1331 Encounter for screening for depression: Secondary | ICD-10-CM | POA: Diagnosis not present

## 2018-05-25 DIAGNOSIS — Z0001 Encounter for general adult medical examination with abnormal findings: Secondary | ICD-10-CM | POA: Diagnosis not present

## 2018-05-25 DIAGNOSIS — E039 Hypothyroidism, unspecified: Secondary | ICD-10-CM | POA: Diagnosis not present

## 2018-05-25 DIAGNOSIS — M48062 Spinal stenosis, lumbar region with neurogenic claudication: Secondary | ICD-10-CM | POA: Diagnosis not present

## 2018-05-25 DIAGNOSIS — G4733 Obstructive sleep apnea (adult) (pediatric): Secondary | ICD-10-CM | POA: Diagnosis not present

## 2018-05-25 DIAGNOSIS — E669 Obesity, unspecified: Secondary | ICD-10-CM | POA: Diagnosis not present

## 2018-05-25 DIAGNOSIS — E782 Mixed hyperlipidemia: Secondary | ICD-10-CM | POA: Diagnosis not present

## 2018-05-28 ENCOUNTER — Telehealth: Payer: Self-pay | Admitting: Emergency Medicine

## 2018-05-28 NOTE — Telephone Encounter (Signed)
I looked in [place] folder both up front and in clinic and did not see anything. Called patient unable to reach left message to give [place] a call back.

## 2018-05-29 NOTE — Telephone Encounter (Signed)
Called and spoke with the patient and made him aware that we haven't received the scans yet. I have called the [facility] medical records department they had not yet received our request so I have re-faxed this to the  request form and have received confirmation that it sent. I will route to [place] to make her aware nothing further needed at this time.

## 2018-05-31 DIAGNOSIS — G4733 Obstructive sleep apnea (adult) (pediatric): Secondary | ICD-10-CM | POA: Diagnosis not present

## 2018-06-06 ENCOUNTER — Other Ambulatory Visit: Payer: Self-pay | Admitting: Emergency Medicine

## 2018-06-06 ENCOUNTER — Telehealth: Payer: Self-pay | Admitting: Emergency Medicine

## 2018-06-06 ENCOUNTER — Ambulatory Visit
Admission: RE | Admit: 2018-06-06 | Discharge: 2018-06-06 | Disposition: A | Discharge status: Home / Self Care | Payer: Self-pay | Source: Ambulatory Visit | Attending: Emergency Medicine | Admitting: Emergency Medicine

## 2018-06-06 DIAGNOSIS — R911 Solitary pulmonary nodule: Secondary | ICD-10-CM

## 2018-06-06 DIAGNOSIS — J449 Chronic obstructive pulmonary disease, unspecified: Secondary | ICD-10-CM

## 2018-06-06 NOTE — Telephone Encounter (Signed)
Please let the patient know that I reviewed his Ct chest from the [place] dated 04/06/18. I agree that the pulmonary nodules appear to be benign. He should not need any repeat scan unless there is a clinical change. Thanks.

## 2018-06-07 NOTE — Telephone Encounter (Signed)
Spoke with pt. He is aware of RB's message. Nothing further was needed.

## 2018-06-18 ENCOUNTER — Encounter: Payer: Self-pay | Admitting: Gastroenterology

## 2018-06-18 DIAGNOSIS — D649 Anemia, unspecified: Secondary | ICD-10-CM | POA: Diagnosis not present

## 2018-06-22 DIAGNOSIS — I25810 Atherosclerosis of coronary artery bypass graft(s) without angina pectoris: Secondary | ICD-10-CM | POA: Diagnosis not present

## 2018-06-22 DIAGNOSIS — I252 Old myocardial infarction: Secondary | ICD-10-CM | POA: Diagnosis not present

## 2018-06-22 DIAGNOSIS — E119 Type 2 diabetes mellitus without complications: Secondary | ICD-10-CM | POA: Diagnosis not present

## 2018-06-22 DIAGNOSIS — E7800 Pure hypercholesterolemia, unspecified: Secondary | ICD-10-CM | POA: Diagnosis not present

## 2018-06-22 DIAGNOSIS — I10 Essential (primary) hypertension: Secondary | ICD-10-CM | POA: Diagnosis not present

## 2018-06-28 DIAGNOSIS — Z6833 Body mass index (BMI) 33.0-33.9, adult: Secondary | ICD-10-CM | POA: Diagnosis not present

## 2018-06-28 DIAGNOSIS — R197 Diarrhea, unspecified: Secondary | ICD-10-CM | POA: Diagnosis not present

## 2018-06-30 DIAGNOSIS — G4733 Obstructive sleep apnea (adult) (pediatric): Secondary | ICD-10-CM | POA: Diagnosis not present

## 2018-07-09 ENCOUNTER — Ambulatory Visit (INDEPENDENT_AMBULATORY_CARE_PROVIDER_SITE_OTHER): Payer: PPO | Admitting: Gastroenterology

## 2018-07-09 ENCOUNTER — Encounter: Payer: Self-pay | Admitting: Gastroenterology

## 2018-07-09 VITALS — Ht 68.0 in | Wt 217.0 lb

## 2018-07-09 DIAGNOSIS — R197 Diarrhea, unspecified: Secondary | ICD-10-CM | POA: Diagnosis not present

## 2018-07-09 MED ORDER — CHOLESTYRAMINE 4 G PO PACK: 4.0000 g | PACK | Freq: Two times a day (BID) | ORAL | 12 refills | Status: DC

## 2018-07-09 NOTE — Progress Notes (Signed)
Chief Complaint:   Referring Provider:  Ernestene Kiel, MD      ASSESSMENT AND PLAN;   #1. Diarrhea. -Rule out infectious etiology, could have postcholecystectomy diarrhea (status post cholecystectomy Apr 16, 2018), neg colon 03/2017. Neg CT abdo/pelvis 02/2017   #2.  Family history of colon cancer (father age above 11)  Plan: -Stool studies for GI Pathogen (includes C. Diff), WBCs, culture. -Cholestramine 1 g p.o. twice daily, 2 hours before or after rest of the medications. -Can try Imodium on as-needed basis. -If still with problems, will perform further work-up.  He will call us in 2 weeks to let us know how he is doing.  I have discussed above in detail with the patient and patient's wife. -Recall colonoscopy due 02/2022. -Patient brings in forms to be filled as he cannot travel to [place] next weekend.  I have filled those forms.    HPI:    Kristopher Wang is a 72 y.o. 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 Denies having any melena or hematochezia Mostly after eating No significant nocturnal symptoms Cannot travel No fever or chills He could not identify any definite foods which will trigger diarrhea more-all foods or p.o. intake triggers diarrhea. No weight loss Had negative colonoscopy as above  Past Medical History:  Diagnosis Date  . Diabetes mellitus ([place])   . GERD (gastroesophageal reflux disease)   . HTN (hypertension)   . Hyperthyroidism   . Myocardial infarct ([place]) 1991    Family History  Problem Relation Age of Onset  . Colon cancer Father     Social History   Tobacco Use  . Smoking status: Former Smoker    Types: Cigarettes    Last attempt to quit: 09/05/1978    Years since quitting: 39.8  . Smokeless tobacco: Never Used  Substance Use Topics  . Alcohol use: Never    Frequency: Never  . Drug use: Not on file    Current Outpatient  Medications  Medication Sig Dispense Refill  . atenolol (TENORMIN) 50 MG tablet Take 50 mg by mouth daily.     . clopidogrel (PLAVIX) 75 MG tablet Take 75 mg by mouth daily.     Marland Kitchen Co-Enzyme Q-10 30 MG CAPS Take 1 capsule by mouth daily.     . dapagliflozin propanediol (FARXIGA) 10 MG TABS tablet TAKE ONE TABLET BY MOUTH EVERY DAY    . ferrous fumarate-iron polysaccharide complex (TANDEM) 162-115.2 MG CAPS capsule TAKE ONE CAPSULE BY MOUTH ONCE DAILY    . furosemide (LASIX) 40 MG tablet Take 20 mg by mouth daily.     Marland Kitchen gabapentin (NEURONTIN) 300 MG capsule Take 300 mg by mouth 2 (two) times daily.     Marland Kitchen glimepiride (AMARYL) 4 MG tablet Take 4 mg by mouth daily with breakfast.     . levothyroxine (SYNTHROID, LEVOTHROID) 50 MCG tablet Take 50 mcg by mouth daily before breakfast.     . lisinopril (PRINIVIL,ZESTRIL) 40 MG tablet Take 40 mg by mouth daily.     . nitroGLYCERIN (NITROSTAT) 0.4 MG SL tablet Place 0.4 mg under the tongue every 5 (five) minutes as needed.     . pantoprazole (PROTONIX) 40 MG tablet Take 40 mg by mouth daily.     . pravastatin (PRAVACHOL) 20 MG tablet Take 20 mg by mouth daily.     . vitamin B-12 (CYANOCOBALAMIN) 1000 MCG tablet Take 1,000 mcg  by mouth daily.      No current facility-administered medications for this visit.     Allergies  Allergen Reactions  . Ibuprofen Swelling    airway airway airway   . Naproxen Sodium Swelling    Facial   . Testosterone Other (See Comments) and Anaphylaxis    Patient had a TIA. Patient had a TIA. Patient had a TIA.   . Tramadol Other (See Comments)    Anger angry, mood swings angry, mood swings   . Nsaids Swelling    Caused nose and mouth to swell  . Sitagliptin     Other reaction(s): Other (See Comments) Decreased blood pressure and blood sugar.    Review of Systems:  Constitutional: Denies fever, chills, diaphoresis, appetite change and fatigue.  HEENT: Denies photophobia, eye pain, redness, hearing loss,  ear pain, congestion, sore throat, rhinorrhea, sneezing, mouth sores, neck pain, neck stiffness and tinnitus.   Respiratory: Denies SOB, DOE, cough, chest tightness,  and wheezing.   Cardiovascular: Denies chest pain, palpitations and leg swelling.  Genitourinary: Denies dysuria, urgency, frequency, hematuria, flank pain and difficulty urinating.  Musculoskeletal: Denies myalgias, back pain, joint swelling, arthralgias and gait problem.  Skin: No rash.  Neurological: Denies dizziness, seizures, syncope, weakness, light-headedness, numbness and headaches.  Hematological: Denies adenopathy. Easy bruising, personal or family bleeding history  Psychiatric/Behavioral: No anxiety or depression     Physical Exam:    Ht 5\' 8"  (1.727 m)   Wt 217 lb (98.4 kg)   BMI 32.99 kg/m  Filed Weights   07/09/18 1508  Weight: 217 lb (98.4 kg)   Constitutional:  Well-developed, in no acute distress. Psychiatric: Normal mood and affect. Behavior is normal. HEENT: Pupils normal.  Conjunctivae are normal. No scleral icterus. Neck supple.  Cardiovascular: Normal rate, regular rhythm. No edema Pulmonary/chest: Effort normal and breath sounds normal. No wheezing, rales or rhonchi. Abdominal: Soft, nondistended. Nontender. Bowel sounds active throughout. There are no masses palpable. No hepatomegaly. Rectal:  defered Neurological: Alert and oriented to person place and time. Skin: Skin is warm and dry. No rashes noted. 25 minutes spent with the patient today. Greater than 50% was spent in counseling and coordination of care with the patient   [name], MD 07/09/2018, 3:30 PM  Cc: Ernestene Kiel, MD

## 2018-07-09 NOTE — Patient Instructions (Signed)
If you are age 71 or older, your body mass index should be between 23-30. Your Body mass index is 32.99 kg/m. If this is out of the aforementioned range listed, please consider follow up with your Primary Care Provider.  If you are age 1 or younger, your body mass index should be between 19-25. Your Body mass index is 32.99 kg/m. If this is out of the aformentioned range listed, please consider follow up with your Primary Care Provider.   We have sent the following medications to your pharmacy for you to pick up at your convenience: Cholestyramine   Please purchase the following medications over the counter and take as directed: Immodium  Please bring your stool specimen back to the lab on the 2nd floor [address].  Thank you,  Dr. Jackquline Denmark

## 2018-07-13 ENCOUNTER — Other Ambulatory Visit (INDEPENDENT_AMBULATORY_CARE_PROVIDER_SITE_OTHER): Payer: PPO

## 2018-07-13 DIAGNOSIS — R197 Diarrhea, unspecified: Secondary | ICD-10-CM | POA: Diagnosis not present

## 2018-07-17 LAB — STOOL CULTURE

## 2018-07-17 LAB — FECAL LACTOFERRIN, QUANT
Fecal Lactoferrin: NEGATIVE
MICRO NUMBER:: 91348194
SPECIMEN QUALITY: ADEQUATE

## 2018-07-17 LAB — GASTROINTESTINAL PATHOGEN PANEL PCR
C. difficile Tox A/B, PCR: NOT DETECTED
CRYPTOSPORIDIUM, PCR: NOT DETECTED
Campylobacter, PCR: NOT DETECTED
E COLI (ETEC) LT/ST, PCR: NOT DETECTED
E coli (STEC) stx1/stx2, PCR: NOT DETECTED
E coli 0157, PCR: NOT DETECTED
Giardia lamblia, PCR: NOT DETECTED
NOROVIRUS, PCR: NOT DETECTED
Rotavirus A, PCR: NOT DETECTED
Salmonella, PCR: NOT DETECTED
Shigella, PCR: NOT DETECTED

## 2018-07-18 ENCOUNTER — Telehealth: Payer: Self-pay | Admitting: Gastroenterology

## 2018-07-18 NOTE — Telephone Encounter (Signed)
Pt calling for lab results, please advise. 

## 2018-07-26 ENCOUNTER — Other Ambulatory Visit: Payer: Self-pay | Admitting: *Deleted

## 2018-07-26 DIAGNOSIS — J449 Chronic obstructive pulmonary disease, unspecified: Secondary | ICD-10-CM

## 2018-07-26 DIAGNOSIS — E1142 Type 2 diabetes mellitus with diabetic polyneuropathy: Secondary | ICD-10-CM

## 2018-07-26 DIAGNOSIS — E119 Type 2 diabetes mellitus without complications: Secondary | ICD-10-CM

## 2018-07-26 DIAGNOSIS — M1990 Unspecified osteoarthritis, unspecified site: Secondary | ICD-10-CM | POA: Insufficient documentation

## 2018-07-26 NOTE — Patient Outreach (Signed)
[facility] [facility]) Care Management  07/26/2018  Kristopher Wang [facility] 1945/12/10 [id number]   TELEPHONE SCREENING Referral date:07/24/18 Referral source: [employer] Referral reason:PHQ score= 18 Insurance: Health Team Advantage Plan I  Telephone call to patient and screening completed. 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  Requires electric scooter to ambulate when outside of home, uses furniture and walls to steady himself when ambulating indoors. Says he has canes but doesn't feel comfortable using them. Despite very unsteady "staggering" gait" he denies falls to date. Regarding depression. PHQ 9 score today was 16. He refused offer of counseling or discussion of antidepressant with provider. He says he is depressed because he used to be very active and his inability to ambulate and chronic health conditions and not being able to "do the things I use to be able to do" is the cause of his depression. States his blood pressure is in good control and he denies recent COPD exacerbation but his blood sugar control has been worse since he had his gallbladder removed with last Hgb A1C= 7.8 (previously 6.1% on 02/22/18) ) and fasting blood sugars average >200.Marland Kitchen Denies hypoglycemia.  Advised patient to contact [facility] for assistance with CPAP mask to promote adherence to CPAP Rx.   PLAN: Patient agrees so will refer to [place] for ongoing assistance with DM and COPD self management assistance.  Barrington Ellison RN,CCM,CDE [place] Management Coordinator Office Phone [phone] Office Fax [phone]

## 2018-07-27 ENCOUNTER — Encounter: Payer: Self-pay | Admitting: *Deleted

## 2018-07-31 DIAGNOSIS — G4733 Obstructive sleep apnea (adult) (pediatric): Secondary | ICD-10-CM | POA: Diagnosis not present

## 2018-08-07 DIAGNOSIS — G4733 Obstructive sleep apnea (adult) (pediatric): Secondary | ICD-10-CM | POA: Diagnosis not present

## 2018-08-24 ENCOUNTER — Other Ambulatory Visit: Payer: Self-pay | Admitting: *Deleted

## 2018-08-24 DIAGNOSIS — R197 Diarrhea, unspecified: Secondary | ICD-10-CM | POA: Diagnosis not present

## 2018-08-24 DIAGNOSIS — Z6833 Body mass index (BMI) 33.0-33.9, adult: Secondary | ICD-10-CM | POA: Diagnosis not present

## 2018-08-24 DIAGNOSIS — I2510 Atherosclerotic heart disease of native coronary artery without angina pectoris: Secondary | ICD-10-CM | POA: Diagnosis not present

## 2018-08-24 DIAGNOSIS — G4733 Obstructive sleep apnea (adult) (pediatric): Secondary | ICD-10-CM | POA: Diagnosis not present

## 2018-08-24 DIAGNOSIS — M48062 Spinal stenosis, lumbar region with neurogenic claudication: Secondary | ICD-10-CM | POA: Diagnosis not present

## 2018-08-24 DIAGNOSIS — E1142 Type 2 diabetes mellitus with diabetic polyneuropathy: Secondary | ICD-10-CM | POA: Diagnosis not present

## 2018-08-24 NOTE — Patient Outreach (Signed)
[facility]) Care Management  08/24/2018  Kristopher Wang 05-Dec-1945 [id number]   RN Health Coach Initial Assessment  Referral Date:  07/26/2018 Referral Source:  EpiSource Screening Reason for Referral:  Disease Management Education Insurance:  Health Team Advantage   Outreach Attempt:  Outreach attempt #1 to patient for initial telephone assessment. No answer and unable to leave voicemail message due to voicemail not engaging.  Plan:  RN Health Coach will make another outreach attempt within the month of December.   [place] [phone] [email or web address]

## 2018-08-30 DIAGNOSIS — G4733 Obstructive sleep apnea (adult) (pediatric): Secondary | ICD-10-CM | POA: Diagnosis not present

## 2018-09-04 ENCOUNTER — Other Ambulatory Visit: Payer: Self-pay | Admitting: *Deleted

## 2018-09-04 NOTE — Patient Outreach (Signed)
[facility] [facility]) Care Management  09/04/2018  Darrian Grzelak 10/17/1945 [id number]   RN Health Coach Initial Assessment  Referral Date:  07/26/2018 Referral Source:  EpiSource Screening Reason for Referral:  Disease Management Education Insurance:  Health Team Advantage   Outreach Attempt:  Outreach attempt #2 to patient for introduction and initial telephone assessment. No answer and unable to leave voicemail message due to voicemail not engaging.  Plan:  RN Health Coach will make another outreach attempt within the month of January.  RN Health Coach will send Unsuccessful Outreach Letter to patient.  [place] [phone] [email or web address]

## 2018-09-05 DIAGNOSIS — G4733 Obstructive sleep apnea (adult) (pediatric): Secondary | ICD-10-CM | POA: Diagnosis not present

## 2018-09-24 ENCOUNTER — Other Ambulatory Visit: Payer: Self-pay | Admitting: *Deleted

## 2018-09-24 NOTE — Patient Outreach (Signed)
[place] [facility]) Care Management  09/24/2018  Irish Piech [facility] [birth year] [id number]   RN Health Coach Initial Assessment  Referral Date:07/26/2018 Referral Source:EpiSource Screening Reason for Referral:Disease Management Education Insurance:Health Team Advantage   Outreach Attempt:  Outreach attempt #3 to patient for introduction and initial telephone assessment.  Wife answered and stated patient was not available.  HIPAA compliant message left with wife.  Patient returned call prior to [place] completing documentation.  HIPAA verified with patient.  RN Health Coach introduced self and role.  Patient verbally agrees to Disease Management outreaches.  States he is unable to complete initial assessment today as he was about to head out the door; requested to return call to [employer].   Plan:  RN Health Coach will make another outreach attempt within the month of February if no return call back from patient.  [place] [phone] [email or web address]

## 2018-09-30 DIAGNOSIS — G4733 Obstructive sleep apnea (adult) (pediatric): Secondary | ICD-10-CM | POA: Diagnosis not present

## 2018-10-08 DIAGNOSIS — E785 Hyperlipidemia, unspecified: Secondary | ICD-10-CM | POA: Diagnosis not present

## 2018-10-08 DIAGNOSIS — I10 Essential (primary) hypertension: Secondary | ICD-10-CM | POA: Diagnosis not present

## 2018-10-08 DIAGNOSIS — L03115 Cellulitis of right lower limb: Secondary | ICD-10-CM | POA: Diagnosis not present

## 2018-10-08 DIAGNOSIS — G4733 Obstructive sleep apnea (adult) (pediatric): Secondary | ICD-10-CM | POA: Diagnosis not present

## 2018-10-08 DIAGNOSIS — E039 Hypothyroidism, unspecified: Secondary | ICD-10-CM | POA: Diagnosis not present

## 2018-10-08 DIAGNOSIS — E1142 Type 2 diabetes mellitus with diabetic polyneuropathy: Secondary | ICD-10-CM | POA: Diagnosis not present

## 2018-10-08 DIAGNOSIS — Z6832 Body mass index (BMI) 32.0-32.9, adult: Secondary | ICD-10-CM | POA: Diagnosis not present

## 2018-10-08 DIAGNOSIS — R197 Diarrhea, unspecified: Secondary | ICD-10-CM | POA: Diagnosis not present

## 2018-10-08 DIAGNOSIS — M48062 Spinal stenosis, lumbar region with neurogenic claudication: Secondary | ICD-10-CM | POA: Diagnosis not present

## 2018-10-10 ENCOUNTER — Telehealth: Payer: Self-pay | Admitting: Gastroenterology

## 2018-10-10 NOTE — Telephone Encounter (Signed)
Can you please assist the patient with this matter;

## 2018-10-10 NOTE — Telephone Encounter (Signed)
Pt called in stating that he needs a letter from the doctor stating when his problem started and ended to be [facility]. He stated that the doctor has done this before however the dates has to be faxed over as well. Pt provided me an email [email or web address]

## 2018-10-11 NOTE — Telephone Encounter (Signed)
Please advise 

## 2018-10-15 ENCOUNTER — Other Ambulatory Visit: Payer: Self-pay | Admitting: *Deleted

## 2018-10-15 NOTE — Patient Outreach (Signed)
[place] [facility]) Care Management  10/15/2018  Kristopher Wang [facility] [birth year] [id number]   RN Health Coach Initial Assessment  Referral Date:07/26/2018 Referral Source:EpiSource Screening Reason for Referral:Disease Management Education Insurance:Health Team Advantage   Outreach Attempt:  Outreach attempt #4 to patient for initial telephone assessment.  Patient answered and verified HIPAA.  States he is unable to complete initial telephone assessment today, requesting a telephone call back.   Plan:  RN Health Coach will make another outreach attempt within the month of March.  [place] [phone] [email or web address]

## 2018-10-23 DIAGNOSIS — M14671 Charcot's joint, right ankle and foot: Secondary | ICD-10-CM | POA: Diagnosis not present

## 2018-10-23 DIAGNOSIS — L97512 Non-pressure chronic ulcer of other part of right foot with fat layer exposed: Secondary | ICD-10-CM | POA: Diagnosis not present

## 2018-10-23 NOTE — Telephone Encounter (Signed)
Kristopher Wang Can you please send letter to the [facility] Please see the last clinic note for details

## 2018-10-26 NOTE — Telephone Encounter (Signed)
I have [rinted the patient a letter and informed him that it would be ready at the [facility] office to pick up.

## 2018-10-30 ENCOUNTER — Other Ambulatory Visit (INDEPENDENT_AMBULATORY_CARE_PROVIDER_SITE_OTHER): Payer: PPO

## 2018-10-30 ENCOUNTER — Ambulatory Visit (INDEPENDENT_AMBULATORY_CARE_PROVIDER_SITE_OTHER): Payer: PPO | Admitting: Gastroenterology

## 2018-10-30 ENCOUNTER — Encounter: Payer: Self-pay | Admitting: Gastroenterology

## 2018-10-30 VITALS — BP 100/68 | HR 61 | Ht 68.0 in | Wt 206.0 lb

## 2018-10-30 DIAGNOSIS — R197 Diarrhea, unspecified: Secondary | ICD-10-CM

## 2018-10-30 DIAGNOSIS — R634 Abnormal weight loss: Secondary | ICD-10-CM

## 2018-10-30 LAB — COMPREHENSIVE METABOLIC PANEL
ALT: 28 U/L (ref 0–53)
AST: 29 U/L (ref 0–37)
Albumin: 4.4 g/dL (ref 3.5–5.2)
Alkaline Phosphatase: 102 U/L (ref 39–117)
BUN: 12 mg/dL (ref 6–23)
CALCIUM: 9.4 mg/dL (ref 8.4–10.5)
CO2: 29 mEq/L (ref 19–32)
Chloride: 100 mEq/L (ref 96–112)
Creatinine, Ser: 1.41 mg/dL (ref 0.40–1.50)
GFR: 49.49 mL/min — AB (ref >60.00)
Glucose, Bld: 176 mg/dL — ABNORMAL HIGH (ref 70–99)
POTASSIUM: 4.5 meq/L (ref 3.5–5.1)
Sodium: 137 mEq/L (ref 135–145)
Total Bilirubin: 0.7 mg/dL (ref 0.2–1.2)
Total Protein: 7.5 g/dL (ref 6.0–8.3)

## 2018-10-30 MED ORDER — DIPHENOXYLATE-ATROPINE 2.5-0.025 MG PO TABS: 1.0000 | ORAL_TABLET | Freq: Two times a day (BID) | ORAL | 0 refills | Status: DC

## 2018-10-30 NOTE — Progress Notes (Signed)
Chief Complaint: FU  Referring Provider:  Ernestene Kiel, MD      ASSESSMENT AND PLAN;   #1. Diarrhea. With some assoc postcholecystectomy diarrhea (s/p chole Apr 16, 2018), neg colon 03/2017. Neg CT abdo/pelvis 02/2017, neg Stool studies for GI Pathogen (includes C. Diff), WBCs, culture. H/O wt loss is concerning.  #2.  Family history of colon cancer (father age above 63)  Plan: -Colestid 1 g p.o. twice daily, 2 hours before or after rest of the medications. -Lomotil 1 tab po bid. -Stop kool-aid/ sodas. -CT abdo/pelvis with PO and IV contrast (d/t wt loss) -If still with problems, will perform further work-up (stool for fecal elastase, H2 breath test or trial of panc enzymes).   -He will call us in 2 weeks to let us know how he is doing. D/w patient and patient's wife. -Recall colonoscopy due 02/2022.  Earlier, if still with problems especially continued diarrhea. -FU in 12 weeks.    HPI:    Kristopher Wang is a 73 y.o. male  Did not tolerate cholestyramine, but is doing better with Colestid Diarrhea is better now with the frequency of 8 to 10/day.  He has normal bowel movements 2 to 3 days in a week. No nausea, vomiting. Denies having any nocturnal symptoms. Stool studies were negative for WBCs, cultures and C. Difficile.  Has lost 10 pounds since the last visit.  He has been trying to lose weight.  Does complain of abdominal bloating and occasional abdominal pain/discomfort mainly in the lower abdomen.  No melena or hematochezia.  Had negative colonoscopy as above  Filed Weights   10/30/18 1050  Weight: 206 lb (93.4 kg)    Past Medical History:  Diagnosis Date  . Diabetes mellitus ([place])   . GERD (gastroesophageal reflux disease)   . HTN (hypertension)   . Hyperthyroidism   . Myocardial infarct ([place]) 1991    Family History  Problem Relation Age of Onset  . Colon cancer Father     Social History   Tobacco Use  . Smoking status: Former Smoker   Types: Cigarettes    Last attempt to quit: 09/05/1978    Years since quitting: 40.1  . Smokeless tobacco: Never Used  Substance Use Topics  . Alcohol use: Never    Frequency: Never  . Drug use: Not on file    Current Outpatient Medications  Medication Sig Dispense Refill  . atenolol (TENORMIN) 50 MG tablet Take 50 mg by mouth daily.     . clopidogrel (PLAVIX) 75 MG tablet Take 75 mg by mouth daily.     Marland Kitchen Co-Enzyme Q-10 30 MG CAPS Take 1 capsule by mouth daily.     . colestipol (COLESTID) 1 g tablet Take 1 g by mouth 2 (two) times daily.    . dapagliflozin propanediol (FARXIGA) 10 MG TABS tablet TAKE ONE TABLET BY MOUTH EVERY DAY    . ferrous fumarate-iron polysaccharide complex (TANDEM) 162-115.2 MG CAPS capsule TAKE ONE CAPSULE BY MOUTH ONCE DAILY    . furosemide (LASIX) 40 MG tablet Take 20 mg by mouth daily.     Marland Kitchen gabapentin (NEURONTIN) 300 MG capsule Take 300 mg by mouth 2 (two) times daily.     Marland Kitchen glimepiride (AMARYL) 4 MG tablet Take 4 mg by mouth daily with breakfast.     . levothyroxine (SYNTHROID, LEVOTHROID) 50 MCG tablet Take 50 mcg by mouth daily before breakfast.     . lisinopril (PRINIVIL,ZESTRIL) 40 MG tablet Take 40 mg by  mouth daily.     . nitroGLYCERIN (NITROSTAT) 0.4 MG SL tablet Place 0.4 mg under the tongue every 5 (five) minutes as needed.     . pantoprazole (PROTONIX) 40 MG tablet Take 40 mg by mouth daily.     . pravastatin (PRAVACHOL) 20 MG tablet Take 20 mg by mouth daily.     . vitamin B-12 (CYANOCOBALAMIN) 1000 MCG tablet Take 1,000 mcg by mouth daily.      No current facility-administered medications for this visit.     Allergies  Allergen Reactions  . Ibuprofen Swelling    airway airway airway   . Naproxen Sodium Swelling    Facial   . Pioglitazone Other (See Comments)    Fluid retention  . Testosterone Other (See Comments) and Anaphylaxis    Patient had a TIA. Patient had a TIA. Patient had a TIA.   . Tramadol Other (See Comments)     Anger angry, mood swings angry, mood swings   . Nsaids Swelling    Caused nose and mouth to swell  . Empagliflozin Other (See Comments)    Pt denies allergy, pt states he begun med one month ago  . Sitagliptin     Other reaction(s): Other (See Comments) Decreased blood pressure and blood sugar.    Review of Systems:  neg     Physical Exam:    BP 100/68   Pulse 61   Ht 5\' 8"  (1.727 m)   Wt 206 lb (93.4 kg)   BMI 31.32 kg/m  Filed Weights   10/30/18 1050  Weight: 206 lb (93.4 kg)   Constitutional:  Well-developed, in no acute distress. Psychiatric: Normal mood and affect. Behavior is normal. HEENT: Pupils normal.  Conjunctivae are normal. No scleral icterus. Neck supple.  Cardiovascular: Normal rate, regular rhythm. No edema Pulmonary/chest: Effort normal and breath sounds normal. No wheezing, rales or rhonchi. Abdominal: Soft, nondistended. Nontender. Bowel sounds active throughout. There are no masses palpable. No hepatomegaly.  Divarication of recti.  Small umbilical hernia. Rectal:  defered Neurological: Alert and oriented to person place and time. Skin: Skin is warm and dry. No rashes noted. 25 minutes spent with the patient today. Greater than 50% was spent in counseling and coordination of care with the patient   [name], MD 10/30/2018, 10:59 AM  Cc: Ernestene Kiel, MD

## 2018-10-30 NOTE — Patient Instructions (Signed)
If you are age 72 or older, your body mass index should be between 23-30. Your Body mass index is 31.32 kg/m. If this is out of the aforementioned range listed, please consider follow up with your Primary Care Provider.  If you are age 60 or younger, your body mass index should be between 19-25. Your Body mass index is 31.32 kg/m. If this is out of the aformentioned range listed, please consider follow up with your Primary Care Provider.   You have been scheduled [facility] CT [facility] [place] [address] [address] [facility]).   You are scheduled on 11/02/18 at 9:30am. You should arrive 15 minutes prior to your appointment time for registration. Please follow the written instructions below on the day of your exam:  WARNING: IF YOU ARE ALLERGIC TO IODINE/X-RAY DYE, PLEASE NOTIFY RADIOLOGY IMMEDIATELY AT [phone]! YOU WILL BE GIVEN A 13 HOUR PREMEDICATION PREP.  1) Do not eat or drink anything after 5:30am (4 hours prior to your test) 2) You have been given 2 bottles of oral contrast to drink. The solution may taste better if refrigerated, but do NOT add ice or any other liquid to this solution. Shake well before drinking.    Drink 1 bottle of contrast @ 7:30am (2 hours prior to your exam)  Drink 1 bottle of contrast @ 8:30am (1 hour prior to your exam)  You may take any medications as prescribed with a small amount of water, if necessary. If you take any of the following medications: METFORMIN, GLUCOPHAGE, GLUCOVANCE, AVANDAMET, RIOMET, FORTAMET, [place] MET, JANUMET, GLUMETZA or METAGLIP, you MAY be asked to HOLD this medication 48 hours AFTER the exam.  The purpose of you drinking the oral contrast is to aid in the visualization of your intestinal tract. The contrast solution may cause some diarrhea. Depending on your individual set of symptoms, you may also receive an intravenous injection of x-ray contrast/dye. Plan on  being at [facility] for 30 minutes or longer, depending on the type of exam you are having performed.  This test typically takes 30-45 minutes to complete.  If you have any questions regarding your exam or if you need to reschedule, you may call the CT department at [phone] between the hours of 8:00 am and 5:00 pm, Monday-Friday.  ________________________________________________________________________   Please go to the lab on the 2nd floor [address] before you leave the office today.   We have given you a prescription for you to pick up at your convenience: Lomotil  Please call Dr. Leland Her nurse Karl Pock, RN)  in 2 weeks at [phone]  to let her now how you are doing.    Thank you,  Dr. Jackquline Denmark

## 2018-10-31 DIAGNOSIS — G4733 Obstructive sleep apnea (adult) (pediatric): Secondary | ICD-10-CM | POA: Diagnosis not present

## 2018-11-02 ENCOUNTER — Ambulatory Visit (HOSPITAL_BASED_OUTPATIENT_CLINIC_OR_DEPARTMENT_OTHER)
Admission: RE | Admit: 2018-11-02 | Discharge: 2018-11-02 | Disposition: A | Discharge status: Home / Self Care | Payer: PPO | Source: Ambulatory Visit | Attending: Gastroenterology | Admitting: Gastroenterology

## 2018-11-02 ENCOUNTER — Encounter (HOSPITAL_BASED_OUTPATIENT_CLINIC_OR_DEPARTMENT_OTHER): Payer: Self-pay

## 2018-11-02 DIAGNOSIS — R197 Diarrhea, unspecified: Secondary | ICD-10-CM | POA: Insufficient documentation

## 2018-11-02 DIAGNOSIS — R634 Abnormal weight loss: Secondary | ICD-10-CM | POA: Insufficient documentation

## 2018-11-02 MED ORDER — IOHEXOL 300 MG/ML  SOLN
100.0000 mL | Freq: Once | INTRAMUSCULAR | Status: AC | PRN
  Administered 2018-11-02: 100 mL via INTRAVENOUS

## 2018-11-06 DIAGNOSIS — L97512 Non-pressure chronic ulcer of other part of right foot with fat layer exposed: Secondary | ICD-10-CM | POA: Diagnosis not present

## 2018-11-19 ENCOUNTER — Other Ambulatory Visit: Payer: Self-pay | Admitting: *Deleted

## 2018-11-19 NOTE — Patient Outreach (Signed)
[place] [facility]) Care Management  11/19/2018  Chayce Robbins [facility] 12-03-1945 [id number]   RN Health Coach Initial Assessment  Referral Date:07/26/2018 Referral Source:Epi[employer] Screening Reason for Referral:Disease Management Education Insurance:[employer] Team Ad[employer]age   Outreach Attempt:  Outreach attempt #5 to patient for initial telephone assessment. No answer. RN Health Coach left HIPAA compliant voicemail message along with contact information.  Plan:  RN Health Coach will make another outreach attempt within the month of April if no return call back from patient.  [place] [phone] [email or web address]

## 2018-11-27 DIAGNOSIS — Z79899 Other long term (current) drug therapy: Secondary | ICD-10-CM | POA: Diagnosis not present

## 2018-11-27 DIAGNOSIS — E1142 Type 2 diabetes mellitus with diabetic polyneuropathy: Secondary | ICD-10-CM | POA: Diagnosis not present

## 2018-11-27 DIAGNOSIS — R0602 Shortness of breath: Secondary | ICD-10-CM | POA: Diagnosis not present

## 2018-11-27 DIAGNOSIS — Z6832 Body mass index (BMI) 32.0-32.9, adult: Secondary | ICD-10-CM | POA: Diagnosis not present

## 2018-11-27 DIAGNOSIS — R1084 Generalized abdominal pain: Secondary | ICD-10-CM | POA: Diagnosis not present

## 2018-11-27 DIAGNOSIS — D509 Iron deficiency anemia, unspecified: Secondary | ICD-10-CM | POA: Diagnosis not present

## 2018-12-07 DIAGNOSIS — M48062 Spinal stenosis, lumbar region with neurogenic claudication: Secondary | ICD-10-CM | POA: Diagnosis not present

## 2018-12-07 DIAGNOSIS — M4726 Other spondylosis with radiculopathy, lumbar region: Secondary | ICD-10-CM | POA: Diagnosis not present

## 2018-12-07 DIAGNOSIS — G8929 Other chronic pain: Secondary | ICD-10-CM | POA: Diagnosis not present

## 2018-12-07 DIAGNOSIS — M79671 Pain in right foot: Secondary | ICD-10-CM | POA: Diagnosis not present

## 2018-12-07 DIAGNOSIS — M47819 Spondylosis without myelopathy or radiculopathy, site unspecified: Secondary | ICD-10-CM | POA: Insufficient documentation

## 2018-12-07 DIAGNOSIS — M79672 Pain in left foot: Secondary | ICD-10-CM | POA: Diagnosis not present

## 2018-12-07 DIAGNOSIS — M25562 Pain in left knee: Secondary | ICD-10-CM | POA: Insufficient documentation

## 2018-12-07 DIAGNOSIS — M25561 Pain in right knee: Secondary | ICD-10-CM | POA: Diagnosis not present

## 2018-12-07 DIAGNOSIS — D225 Melanocytic nevi of trunk: Secondary | ICD-10-CM | POA: Diagnosis not present

## 2018-12-07 DIAGNOSIS — Z79891 Long term (current) use of opiate analgesic: Secondary | ICD-10-CM | POA: Diagnosis not present

## 2018-12-10 ENCOUNTER — Other Ambulatory Visit: Payer: Self-pay | Admitting: Gastroenterology

## 2018-12-13 ENCOUNTER — Other Ambulatory Visit: Payer: Self-pay | Admitting: *Deleted

## 2018-12-13 NOTE — Patient Outreach (Signed)
[facility] [facility]) Care Management  12/13/2018  Kristopher Wang [facility] September 10, 1945 [id number]   RN Health Coach Initial Assessment  Referral Date:07/26/2018 Referral Source:EpiSource Screening Reason for Referral:Disease Management Education Insurance:Health Team Advantage   Outreach Attempt:  Outreach attempt #6 to patient for initial telephone assessment. No answer. RN Health Coach left HIPAA compliant voicemail message along with contact information.  Plan:  RN Health Coach will send unsuccessful outreach letter to patient.  RN Health Coach will make another outreach attempt to patient within the month of May if no return call back from patient.  [place] [phone] [email or web address]

## 2018-12-24 DIAGNOSIS — I10 Essential (primary) hypertension: Secondary | ICD-10-CM | POA: Diagnosis not present

## 2018-12-24 DIAGNOSIS — G4733 Obstructive sleep apnea (adult) (pediatric): Secondary | ICD-10-CM | POA: Diagnosis not present

## 2018-12-24 DIAGNOSIS — E1142 Type 2 diabetes mellitus with diabetic polyneuropathy: Secondary | ICD-10-CM | POA: Diagnosis not present

## 2018-12-24 DIAGNOSIS — Z1331 Encounter for screening for depression: Secondary | ICD-10-CM | POA: Diagnosis not present

## 2018-12-24 DIAGNOSIS — Z0000 Encounter for general adult medical examination without abnormal findings: Secondary | ICD-10-CM | POA: Diagnosis not present

## 2018-12-24 DIAGNOSIS — Z1339 Encounter for screening examination for other mental health and behavioral disorders: Secondary | ICD-10-CM | POA: Diagnosis not present

## 2019-01-03 ENCOUNTER — Other Ambulatory Visit: Payer: Self-pay | Admitting: Gastroenterology

## 2019-01-18 DIAGNOSIS — K219 Gastro-esophageal reflux disease without esophagitis: Secondary | ICD-10-CM | POA: Diagnosis not present

## 2019-01-18 DIAGNOSIS — I10 Essential (primary) hypertension: Secondary | ICD-10-CM | POA: Diagnosis not present

## 2019-01-18 DIAGNOSIS — E039 Hypothyroidism, unspecified: Secondary | ICD-10-CM | POA: Diagnosis not present

## 2019-01-18 DIAGNOSIS — Z6831 Body mass index (BMI) 31.0-31.9, adult: Secondary | ICD-10-CM | POA: Diagnosis not present

## 2019-01-18 DIAGNOSIS — G4733 Obstructive sleep apnea (adult) (pediatric): Secondary | ICD-10-CM | POA: Diagnosis not present

## 2019-01-18 DIAGNOSIS — E1142 Type 2 diabetes mellitus with diabetic polyneuropathy: Secondary | ICD-10-CM | POA: Diagnosis not present

## 2019-01-18 DIAGNOSIS — M549 Dorsalgia, unspecified: Secondary | ICD-10-CM | POA: Diagnosis not present

## 2019-01-18 DIAGNOSIS — E785 Hyperlipidemia, unspecified: Secondary | ICD-10-CM | POA: Diagnosis not present

## 2019-01-23 ENCOUNTER — Encounter: Payer: Self-pay | Admitting: *Deleted

## 2019-01-23 ENCOUNTER — Other Ambulatory Visit: Payer: Self-pay | Admitting: *Deleted

## 2019-01-23 DIAGNOSIS — E079 Disorder of thyroid, unspecified: Secondary | ICD-10-CM | POA: Insufficient documentation

## 2019-01-23 DIAGNOSIS — Z9889 Other specified postprocedural states: Secondary | ICD-10-CM | POA: Insufficient documentation

## 2019-01-23 DIAGNOSIS — K449 Diaphragmatic hernia without obstruction or gangrene: Secondary | ICD-10-CM | POA: Insufficient documentation

## 2019-01-23 DIAGNOSIS — R112 Nausea with vomiting, unspecified: Secondary | ICD-10-CM | POA: Insufficient documentation

## 2019-01-23 NOTE — Patient Outreach (Signed)
[place] [facility]) Care Management  [place]  01/23/2019   [place] 1946-04-24 [id number]   RN Health Coach Initial Assessment   Referral Date:  07/26/2018 Referral Source:  EpiSource Screening Reason for Referral:  Disease Management Education Insurance:  Health Team Advantage   Outreach Attempt:  Successful telephone outreach to patient for introduction and initial telephone assessment.  HIPAA verified with patient.  RN Health Coach introduced self and role.  Patient verbally agrees to Disease Management Outreaches.  Patient completed initial telephone assessment.  Social:   Patient lives at home with wife.  Reports being independent with ADLs and IADLs.  Ambulates with Rolator walker occasionally, but mostly uses motorized scooter/wheelchair when outside of the home.  Home is too narrow to use scooter in his home.  Does report about 2 falls in the past year, related to knee pain/weakness and just losing balance.  Fall precautions and preventions reviewed and discussed.  Encouraged to use Rolator walker with all ambulation inside the home.  Patient states he transports himself to his medical appointments.  DME in the home include:  Reading glasses, Rolator walker, quad cane, camel cane, upper and lower dentures, CBG meter, blood pressure cuff, electric scooter, scale, CPAP, and grab bars in the shower.  Conditions:  Per chart review and discussion with patient, PMH include but not limited to:  Diabetes, GERD, hypertension, hypothyroidism, myocardial infarction, anemia, spinal stenosis, charcot's foot, chronic bilateral knee and back pain, cholecystectomy, chronic diarrhea, arthritis, agent orange exposure, benign prostatic hyperplasia, COPD, emphysema, coronary artery bypass grafting, cardiac stent placement, transient ischemic attack, and hyperlipidemia.  Denies any recent emergency room visits or hospitalizations.  Does report chronic pain and attending the pain  clinic.  States he is not using his pain patch due to the cost and side effects; states he use occasional Tylenol #3.  Also reports chronic diarrhea and following gastroenterology for this.  Monitors blood sugars daily.  Fasting blood sugar this morning was 112 with normal fasting ranges in 80's.  Last Hgb A1C was 7.6 in September 2019.  Denies any recent hypoglycemic events.  Does report eating only about 1 meal a day (breakfast) with about a 30 pound weight loss over the past several months.  Medications:  Patient reports taking about 20 medications.  Manages his medications himself with weekly pill box fills.  Obtains part of his medications from the [place] in [place].  Does report his [place] would typically cost about $500 dollars a month and he is currently in his last 3 months to receive it at a reduced cost of $100 dollars a month.  Patient is unsure of program he is currently on to obtain medication at reduced price.  [place] discussed and patient verbally agrees to referral.   Encounter Medications:  Outpatient Encounter Medications as of 01/23/2019  Medication Sig Note  . acetaminophen-codeine (TYLENOL #3) 300-30 MG tablet as needed.   Marland Kitchen albuterol (VENTOLIN HFA) 108 (90 Base) MCG/ACT inhaler Inhale into the lungs as needed.   Marland Kitchen aspirin EC 81 MG tablet Take by mouth daily.   Marland Kitchen atenolol (TENORMIN) 50 MG tablet Take 50 mg by mouth daily.    . budesonide-formoterol (SYMBICORT) 160-4.5 MCG/ACT inhaler Inhale into the lungs as needed.   . Cholecalciferol (D-3-5) 125 MCG (5000 UT) capsule Take by mouth daily.   . clopidogrel (PLAVIX) 75 MG tablet Take 75 mg by mouth daily.    Marland Kitchen Co-Enzyme Q-10 30 MG CAPS Take 1 capsule by  mouth daily.    . colestipol (COLESTID) 1 g tablet Take 1 g by mouth 2 (two) times daily.   . dapagliflozin propanediol (FARXIGA) 10 MG TABS tablet TAKE ONE TABLET BY MOUTH EVERY DAY 01/23/2019: Reports taking 5 mg daily  . diphenoxylate-atropine (LOMOTIL) 2.5-0.025 MG tablet  TAKE ONE TABLET BY MOUTH TWICE DAILY   . ferrous fumarate-iron polysaccharide complex (TANDEM) 162-115.2 MG CAPS capsule TAKE ONE CAPSULE BY MOUTH ONCE DAILY   . furosemide (LASIX) 40 MG tablet Take 20 mg by mouth daily.    Marland Kitchen gabapentin (NEURONTIN) 300 MG capsule Take 300 mg by mouth 2 (two) times daily.  01/23/2019: Reports taking 200 mg three times a day  . glimepiride (AMARYL) 4 MG tablet Take 4 mg by mouth daily with breakfast.    . levothyroxine (SYNTHROID, LEVOTHROID) 50 MCG tablet Take 50 mcg by mouth daily before breakfast.    . lisinopril (PRINIVIL,ZESTRIL) 40 MG tablet Take 40 mg by mouth daily.    . Multiple Vitamins-Minerals (MULTIVITAMIN ADULT EXTRA C PO) Take by mouth daily.   . nitroGLYCERIN (NITROSTAT) 0.4 MG SL tablet Place 0.4 mg under the tongue every 5 (five) minutes as needed.    . Omega-3 1000 MG CAPS Take by mouth daily.   . pantoprazole (PROTONIX) 40 MG tablet Take 40 mg by mouth daily.    . pravastatin (PRAVACHOL) 20 MG tablet Take 20 mg by mouth daily.    . vitamin B-12 (CYANOCOBALAMIN) 1000 MCG tablet Take 1,000 mcg by mouth daily.     No facility-administered encounter medications on file as of 01/23/2019.     Functional Status:  In your present state of health, do you have any difficulty performing the following activities: 01/23/2019  Hearing? Y  Comment hard of hearing at times  Vision? Y  Comment reading glasses  Difficulty concentrating or making decisions? Y  Comment forgetful  Walking or climbing stairs? Y  Comment uses motorized wheelchair due to bilateral knee pain  Dressing or bathing? N  Doing errands, shopping? N  Preparing Food and eating ? N  Using the Toilet? N  In the past six months, have you accidently leaked urine? N  Do you have problems with loss of bowel control? N  Managing your Medications? N  Managing your Finances? N  Housekeeping or managing your Housekeeping? N  Some recent data might be hidden    Fall/Depression  Screening: Fall Risk  01/23/2019  Falls in the past year? 1  Number falls in past yr: 1  Injury with Fall? 0  Risk for fall due to : History of fall(s);Impaired vision;Medication side effect;Impaired balance/gait;Impaired mobility  Follow up Falls evaluation completed;Falls prevention discussed;Education provided   PHQ 2/9 Scores 01/23/2019 07/26/2018  PHQ - 2 Score 0 2  PHQ- 9 Score - 16   THN CM Care Plan Problem One     Most Recent Value  Care Plan Problem One  Knowledge deficiet related to self care management of diabetes.  Role Documenting the Problem One  [place] for Problem One  Active  [facility] Long Term Goal   Patient will report a decrease in Hgb A1C by 0.2 points in the next 60 days. (7.6 currently)  THN Long Term Goal Start Date  01/23/19  Interventions for Problem One Long Term Goal  Current care plan and goals reviewed and discussed with patient, encouraged to keep and attend medical appointments, encouraged to continue to monitor blood sugars atleast daily, sending 2020 Calendar  Booklet to help track medical appointments and document blood sugar readings, reviewed medications and indications and encouraged medication compliance, [place] referral for medication cost assistance with [employer], discussed current Hgb A1C and ways to reduce, encouraged patient to discuss goal A1C with providers, fall precautions and preventions reviewed and discussed, instructed and encouraged patient to use walker with all ambulation in the home  [facility] CM Short Term Goal #1   Patient will report eating more than one meal a day in the next 60 days.  THN CM Short Term Goal #1 Start Date  01/23/19  Interventions for Short Term Goal #1  Discussed with patient importance of eating well balanced diet throught the day, especially taking diabetic medications in hopes to prevent hypoglycemia, sending EMMI Diabetes and Diet, sending EMMI Diabetes Diet, discussed and encouraged drinking glucerna,  reviewed signs and symptoms of hypo and hyperglycemia, offered to send Living Well with DIabetes Booklet (patient declines at this time)     Appointments:  Attended appointment with primary care provider, Dr. Laqueta Due on 01/18/2019 and has follow up scheduled for September 2020.  Advanced Directives:  Reports having a Living Will and [place] in place and does not wish to make any changes to these documents.   Consent:  [facility] services reviewed and discussed.  Patient verbally agrees to Disease Management outreaches and [place] referral for medication assistance.  Plan: RN Health Coach will send primary MD barriers letter. RN Health Coach will route initial telephone assessment note to primary MD. [place] will send patient [place]. RN Health Coach will send patient 2020 Calendar Booklet. RN Health Coach will send patient EMMI Diabetes and Diet. RN Health Coach will send patient EMMI Diabetes Diet. RN Health Coach will make next telephone outreach to patient in the month of July.  [place] Coach [phone] [email or web address]

## 2019-01-23 NOTE — Addendum Note (Signed)
Addended by: Hubert Azure D on: 01/23/2019 02:00 PM   Modules accepted: Orders

## 2019-01-24 ENCOUNTER — Telehealth: Payer: Self-pay | Admitting: Pharmacist

## 2019-01-24 NOTE — Patient Outreach (Signed)
[facility]) Care Management  01/24/2019  Kristopher Wang [birth year] [id number]  Patient was called regarding medication assistance per referral. HIPAA identifiers were obtained. Patient said he could not talk at the time of my call. I scheduled an appointment with the patient tomorrow (Friday) at 8:30am for medication review.  Plan: Call patient tomorrow as discussed.  Elayne Guerin, PharmD, [place] Clinical Pharmacist [phone]

## 2019-01-25 ENCOUNTER — Other Ambulatory Visit: Payer: Self-pay | Admitting: Pharmacist

## 2019-01-25 NOTE — Patient Outreach (Signed)
[place] [facility]) Care Management  [place]   01/25/2019  [place] [birth year] [id number]  Reason for referral: Medication Assistance with Farxiga  Referral source: [facility] Telephonic Nurse Referral medication(s): Wilder Glade Current insurance: [employer]  HPI:  Patient was called regarding medication assistance. HIPAA identifiers were obtained. He is a 73 year old male with multiple medical conditions including but not limited to:  Anemia, arthritis, CAD, cervical spondylosis, COPD, type 2 diabetes with secondary polyneuropathy, HTN, GERD and hypothyroidism.   Patient reported his pharmacy provided him with a coupon to get [place] for $100 a month but that coupon will run out in 5 months.  He also wondered if there were other programs he may be eligible for to help with the cost.  Objective: Allergies  Allergen Reactions  . Ibuprofen Swelling    airway airway airway   . Naproxen Sodium Swelling    Facial   . Pioglitazone Other (See Comments)    Fluid retention  . Testosterone Other (See Comments) and Anaphylaxis    Patient had a TIA. Patient had a TIA. Patient had a TIA.   . Tramadol Other (See Comments)    Anger angry, mood swings angry, mood swings   . Nsaids Swelling    Caused nose and mouth to swell  . Empagliflozin Other (See Comments)    Pt denies allergy, pt states he begun med one month ago  . Sitagliptin     Other reaction(s): Other (See Comments) Decreased blood pressure and blood sugar.    Medications Reviewed Today    Reviewed by Elayne Guerin, [facility] (Pharmacist) on 01/25/19 at [place] List Status: <None>  Medication Order Taking? Sig Documenting Provider Last Dose Status Informant  acetaminophen-codeine (TYLENOL #3) 300-30 MG tablet [id number] Yes Take 1 tablet by mouth as needed.  [provider] Taking Active   albuterol (VENTOLIN HFA) 108 (90 Base) MCG/ACT inhaler [id number] Yes Inhale 2 puffs into the  lungs as needed.  [provider] Taking Active   ascorbic acid (VITAMIN C) 500 MG tablet [id number] Yes Take 1 tablet by mouth daily. [provider] Taking Active   aspirin EC 81 MG tablet [id number] Yes Take by mouth daily. [provider] Taking Active   atenolol (TENORMIN) 50 MG tablet [id number] Yes Take 50 mg by mouth daily.  [provider] Taking Active   budesonide-formoterol (SYMBICORT) 160-4.5 MCG/ACT inhaler [id number] Yes Inhale 2 puffs into the lungs as needed.  [provider] Taking Active   Cholecalciferol (D-3-5) 125 MCG (5000 UT) capsule [id number] Yes Take by mouth daily. [provider] Taking Active   clopidogrel (PLAVIX) 75 MG tablet [id number] Yes Take 75 mg by mouth daily.  [provider] Taking Active   Co-Enzyme Q-10 30 MG CAPS [id number] Yes Take 1 capsule by mouth daily.  [provider] Taking Active   colestipol (COLESTID) 1 g tablet [id number] Yes Take 1 g by mouth 2 (two) times daily. [provider] Taking Active   diphenoxylate-atropine (LOMOTIL) 2.5-0.025 MG tablet [id number] Yes TAKE ONE TABLET BY MOUTH TWICE DAILY Jackquline Denmark, MD Taking Active   FARXIGA 5 MG TABS tablet [id number] Yes Take 5 mg by mouth daily. [provider] Taking Active   ferrous fumarate-iron polysaccharide complex (TANDEM) 162-115.2 MG CAPS capsule [id number] Yes TAKE ONE CAPSULE BY MOUTH ONCE DAILY [provider] Taking Active   furosemide (LASIX) 40 MG tablet [id number] Yes Take 20 mg by mouth daily.  [provider] Taking Active   gabapentin (NEURONTIN) 800 MG tablet [id number] Yes Take 2 tablets by mouth 3 (three) times daily. [provider] Taking Active   glimepiride (AMARYL) 4 MG tablet [id number] Yes Take 4 mg by mouth daily with breakfast.  [provider] Taking Active   levothyroxine (SYNTHROID, LEVOTHROID) 50 MCG tablet [id number] Yes Take 50 mcg by mouth daily before  breakfast.  [provider] Taking Active   lisinopril (PRINIVIL,ZESTRIL) 40 MG tablet [id number] Yes Take 40 mg by mouth daily.  [provider] Taking Active   MULTIPLE VITAMIN PO [id number] Yes Take 1 tablet by mouth daily. [provider] Taking Active   nitroGLYCERIN (NITROSTAT) 0.4 MG SL tablet [id number] Yes Place 0.4 mg under the tongue every 5 (five) minutes as needed.  [provider] Taking Active   Omega-3 1000 MG CAPS [id number] Yes Take by mouth daily. [provider] Taking Active   pantoprazole (PROTONIX) 40 MG tablet [id number] Yes Take 40 mg by mouth daily.  [provider] Taking Active   pravastatin (PRAVACHOL) 20 MG tablet [id number] Yes Take 20 mg by mouth daily.  [provider] Taking Active   vitamin B-12 (CYANOCOBALAMIN) 1000 MCG tablet [id number] Yes Take 1,000 mcg by mouth daily.  [provider] Taking Active           Assessment:  Drugs sorted by system:  Neurologic/Psychologic: Gabapentin   Cardiovascular: Aspirin, Atenolol, Clopidogrel, Furosemide, Lisinopril, Nitroglycerin, Pravastatin  Pulmonary/Allergy: Ventolin HFA, Symbicort,   Gastrointestinal: Colestipol, Diphenoxylate-atropine, Pantoprazole  Endocrine: Farxiga, Glimepiride, Levothyroxine  Pain: Acetaminophen-Codeine,   Vitamins/Minerals/Supplements: Ascorbic Acid, Cholecalciferol, CoEnzyme Q-10, Ferrous Fumarate-iron polysaccharide, Multiple Vitamin, Omega 3 Fatty Acid, Vitamin B12   Medication Review Findings:  . HgA1c- 7.6% 2019   Medication Assistance Findings:  Medication assistance needs identified: [place].   Wilder Glade is non-formulary on [employer].  Patient has a non-formulary exception completed that is approved through the end of 2020.  The patient reported that [facility] is billing his Wilder Glade refills with a coupon and that he has been paying $100 per month.  [facility] was called. They  reported that they are not using a coupon. They are billing [facility].  [facility] confirmed the patient's formulary exception. As such, his copay is $90 for a 30 day supply of [place].  Once a formulary exception is done, a tier reduction request cannot be completed ( it is one or the other).  Therapeutic alternatives are Invokana and Jardiance--patient said he had a reaction to both medications.  Patient MAY be able to apply to the [employer] program.  Their program requires patients to spend 3% of their income in medication expenses to qualify for their program.    According to [facility]:  No LIS (over income), OOP $343.99, TDS  [phone]  Patient has not spent the required 3% to qualify for the program.   Patient was encouraged to call back when his spending gets to approx. $1800  Additional medication assistance options reviewed with patient as warranted:  No other options identified  Plan: Close pharmacy case. Will gladly reopen the patient's case if he reaches the [facility] or for future medication questions or concerns.  Elayne Guerin, PharmD, [place] Clinical Pharmacist [phone]

## 2019-02-05 DIAGNOSIS — E119 Type 2 diabetes mellitus without complications: Secondary | ICD-10-CM | POA: Diagnosis not present

## 2019-02-05 DIAGNOSIS — I25810 Atherosclerosis of coronary artery bypass graft(s) without angina pectoris: Secondary | ICD-10-CM | POA: Diagnosis not present

## 2019-02-05 DIAGNOSIS — I252 Old myocardial infarction: Secondary | ICD-10-CM | POA: Diagnosis not present

## 2019-02-05 DIAGNOSIS — E7800 Pure hypercholesterolemia, unspecified: Secondary | ICD-10-CM | POA: Diagnosis not present

## 2019-02-05 DIAGNOSIS — Z951 Presence of aortocoronary bypass graft: Secondary | ICD-10-CM | POA: Diagnosis not present

## 2019-02-05 DIAGNOSIS — I10 Essential (primary) hypertension: Secondary | ICD-10-CM | POA: Diagnosis not present

## 2019-02-19 DIAGNOSIS — M14671 Charcot's joint, right ankle and foot: Secondary | ICD-10-CM | POA: Diagnosis not present

## 2019-02-19 DIAGNOSIS — L97512 Non-pressure chronic ulcer of other part of right foot with fat layer exposed: Secondary | ICD-10-CM | POA: Diagnosis not present

## 2019-02-19 DIAGNOSIS — M6701 Short Achilles tendon (acquired), right ankle: Secondary | ICD-10-CM | POA: Diagnosis not present

## 2019-02-28 ENCOUNTER — Other Ambulatory Visit: Payer: Self-pay | Admitting: *Deleted

## 2019-02-28 NOTE — Patient Outreach (Signed)
[place] [facility]) Care Management  02/28/2019  [place] C[place] 17-Jun-1946 [id number]   Case Closure/Transition to [facility]  Referral Date:07/26/2018 Referral Source:[employer] Screening Reason for Referral:Disease Management Education Insurance:[employer]   Outreach:  Patient case has been transitioned to [facility]I for further Care Management assistance.  Plan: RN Health Coach will close case at this time. RN Health Coach will send primary care provider Care Management Case Closure Letter. RN Health Coach will make patient inactive with [facility] Care Management at this time.  [place] [phone] [email or web address]

## 2019-03-18 DIAGNOSIS — M79675 Pain in left toe(s): Secondary | ICD-10-CM | POA: Diagnosis not present

## 2019-03-18 DIAGNOSIS — Z6831 Body mass index (BMI) 31.0-31.9, adult: Secondary | ICD-10-CM | POA: Diagnosis not present

## 2019-03-21 DIAGNOSIS — E1142 Type 2 diabetes mellitus with diabetic polyneuropathy: Secondary | ICD-10-CM | POA: Diagnosis not present

## 2019-03-21 DIAGNOSIS — S99922A Unspecified injury of left foot, initial encounter: Secondary | ICD-10-CM | POA: Diagnosis not present

## 2019-03-21 DIAGNOSIS — S90222A Contusion of left lesser toe(s) with damage to nail, initial encounter: Secondary | ICD-10-CM | POA: Diagnosis not present

## 2019-03-26 ENCOUNTER — Ambulatory Visit: Payer: PPO | Admitting: *Deleted

## 2019-04-04 DIAGNOSIS — M6702 Short Achilles tendon (acquired), left ankle: Secondary | ICD-10-CM | POA: Diagnosis not present

## 2019-04-04 DIAGNOSIS — M722 Plantar fascial fibromatosis: Secondary | ICD-10-CM | POA: Diagnosis not present

## 2019-04-12 DIAGNOSIS — H2513 Age-related nuclear cataract, bilateral: Secondary | ICD-10-CM | POA: Diagnosis not present

## 2019-04-12 DIAGNOSIS — E119 Type 2 diabetes mellitus without complications: Secondary | ICD-10-CM | POA: Diagnosis not present

## 2019-04-15 DIAGNOSIS — Z20828 Contact with and (suspected) exposure to other viral communicable diseases: Secondary | ICD-10-CM | POA: Diagnosis not present

## 2019-04-15 DIAGNOSIS — R001 Bradycardia, unspecified: Secondary | ICD-10-CM | POA: Diagnosis not present

## 2019-04-15 DIAGNOSIS — R509 Fever, unspecified: Secondary | ICD-10-CM | POA: Diagnosis not present

## 2019-04-15 DIAGNOSIS — B349 Viral infection, unspecified: Secondary | ICD-10-CM | POA: Diagnosis not present

## 2019-04-15 DIAGNOSIS — J90 Pleural effusion, not elsewhere classified: Secondary | ICD-10-CM | POA: Diagnosis not present

## 2019-04-25 DIAGNOSIS — M722 Plantar fascial fibromatosis: Secondary | ICD-10-CM | POA: Diagnosis not present

## 2019-04-25 DIAGNOSIS — M6702 Short Achilles tendon (acquired), left ankle: Secondary | ICD-10-CM | POA: Diagnosis not present

## 2019-05-02 IMAGING — CT CT OUTSIDE FILMS CHEST
2 of 5 series · 16 of 36 positions shown, 20 images · non-contrast
Comparison: none

[Series 5: recon 4: without contrast · axial · non-contrast · 0.85mm/px · z∈[-247,+4]mm · 13 of 291 slices shown, 17 images]
[im 20/291  mediastinal]
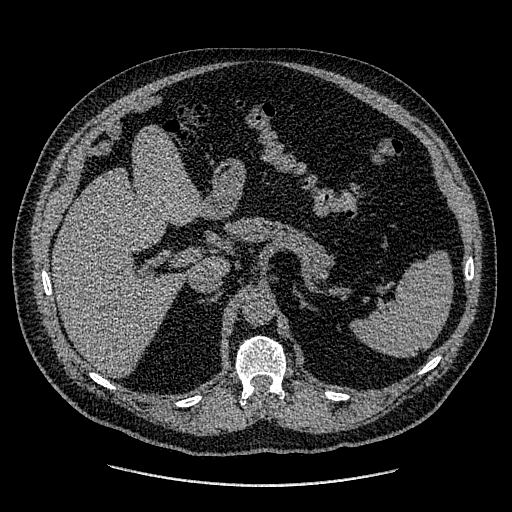
[im 20/291  lung]
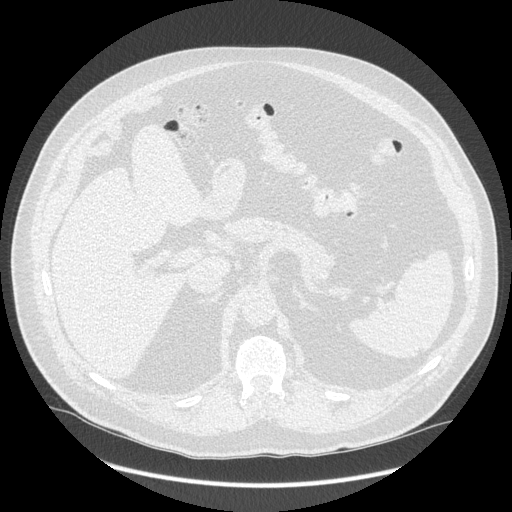
[im 39/291  lung]
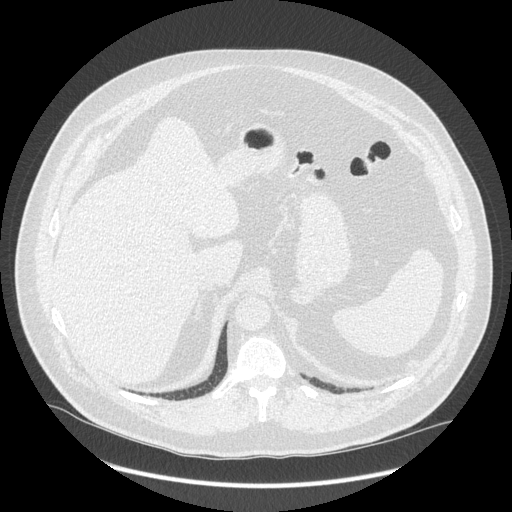
[im 59/291  lung]
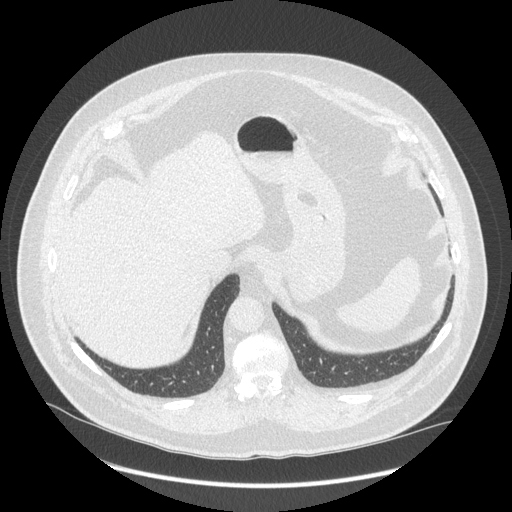
[im 78/291  lung]
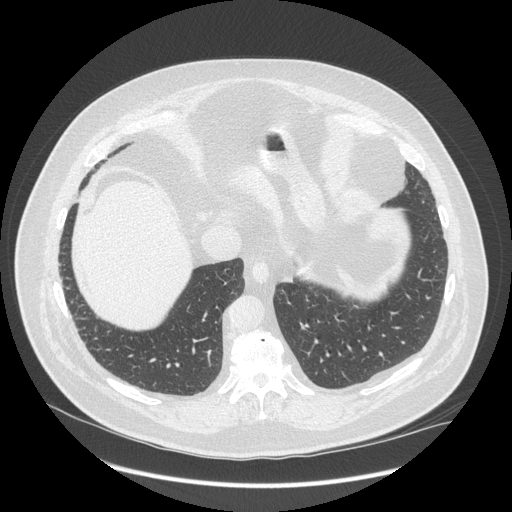
[im 97/291  mediastinal]
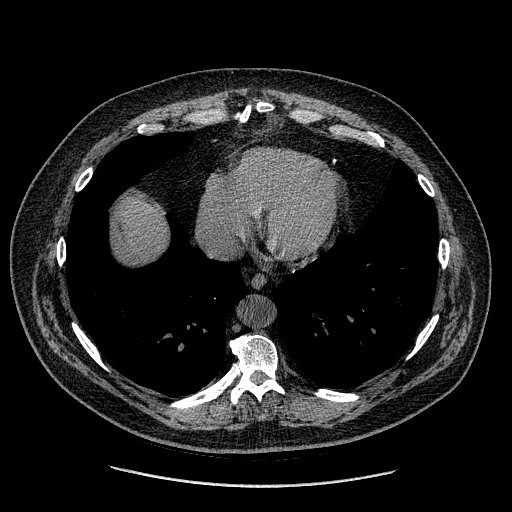
[im 97/291  lung]
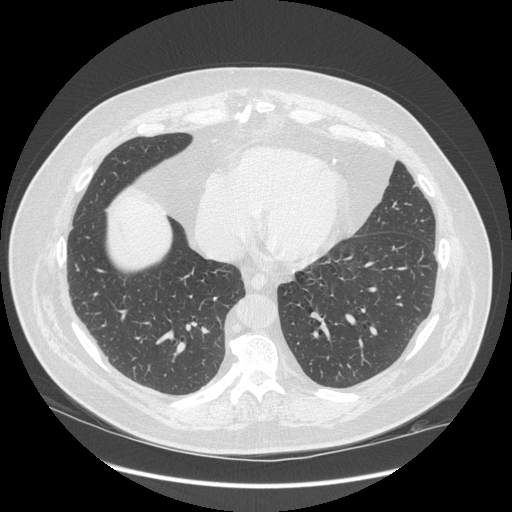
[im 117/291  lung]
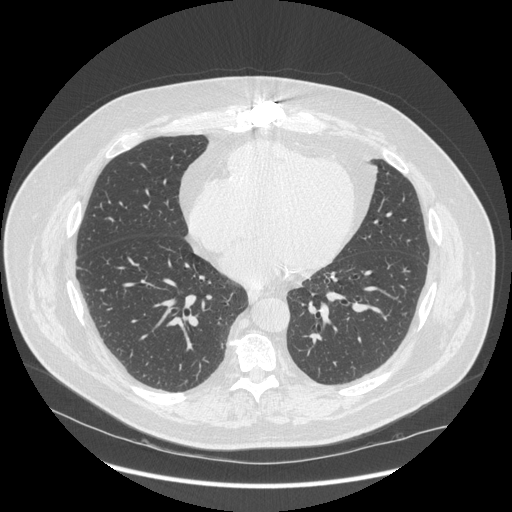
[im 155/291  lung]
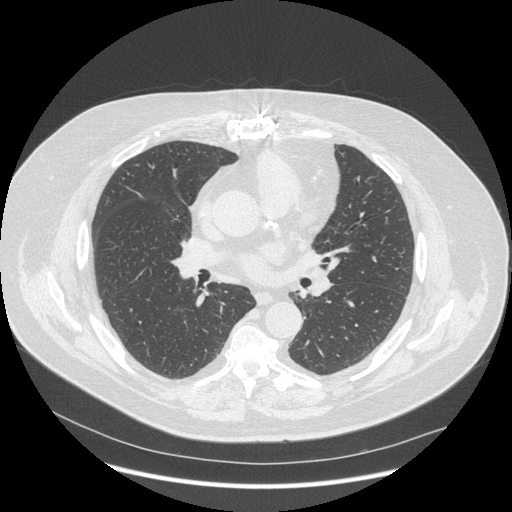
[im 175/291  lung]
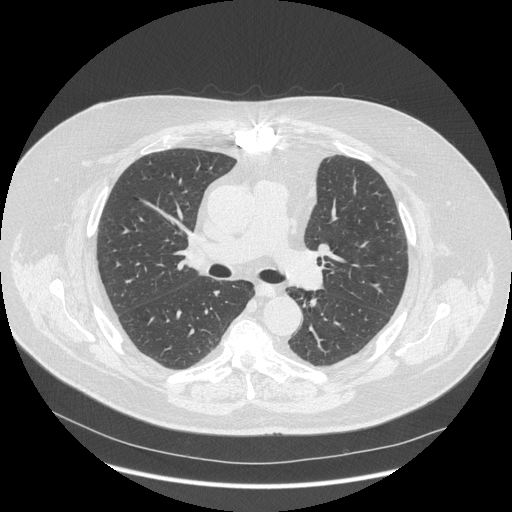
[im 194/291  mediastinal]
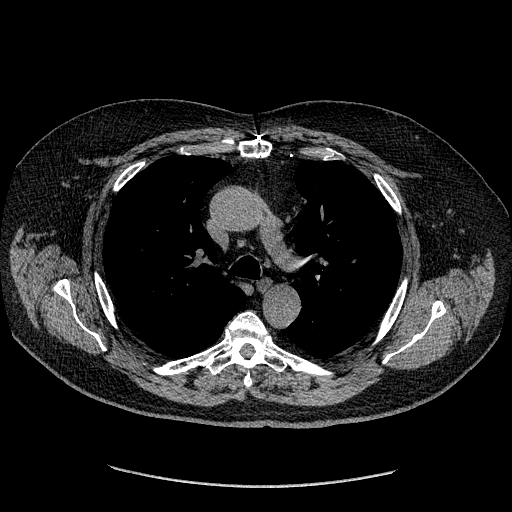
[im 194/291  lung]
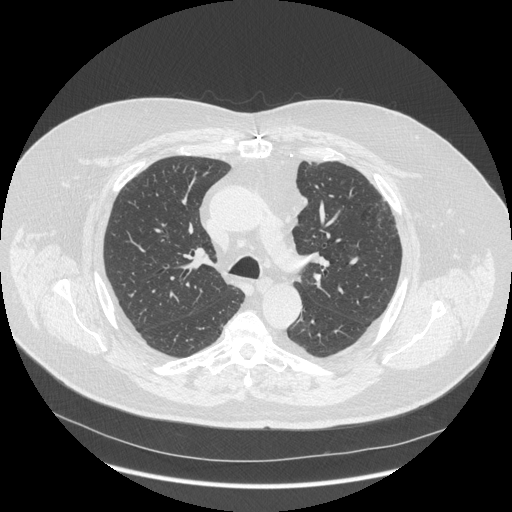
[im 213/291  lung]
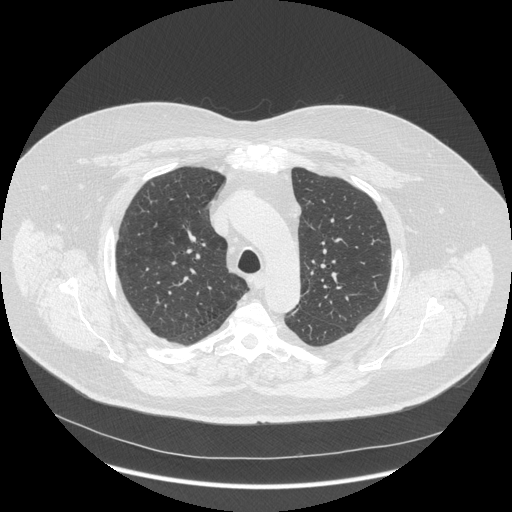
[im 233/291  lung]
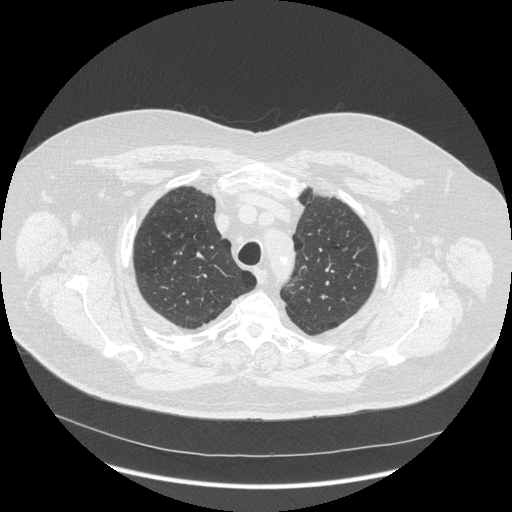
[im 252/291  lung]
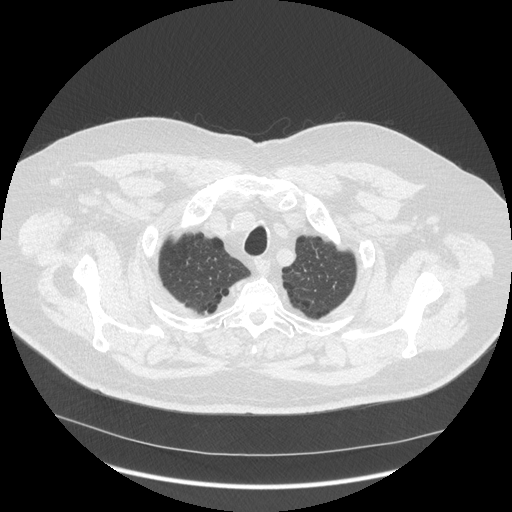
[im 271/291  mediastinal]
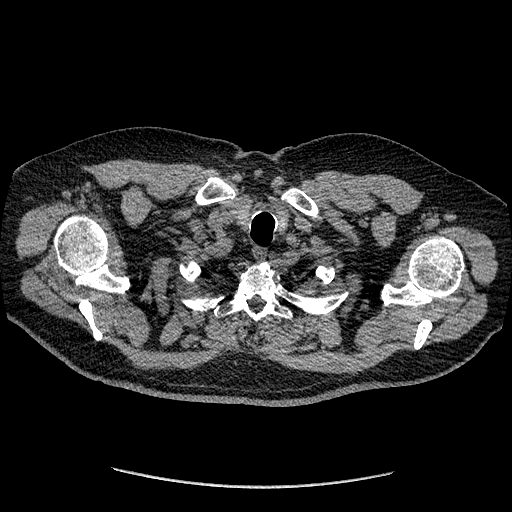
[im 271/291  lung]
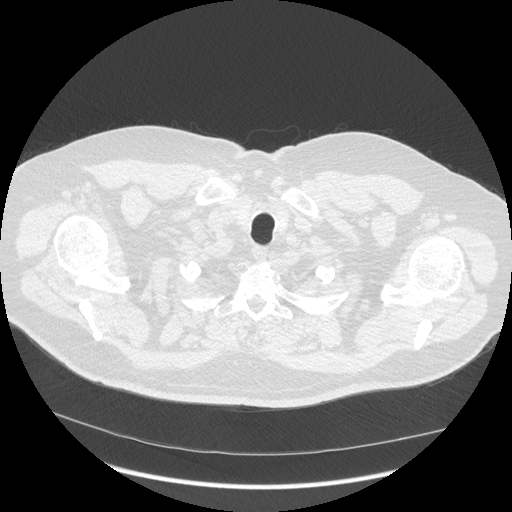

[Series 601: sagittal 2x2 · sagittal · 0.85mm/px · 3 of 218 slices shown]
[im 44/218  lung]
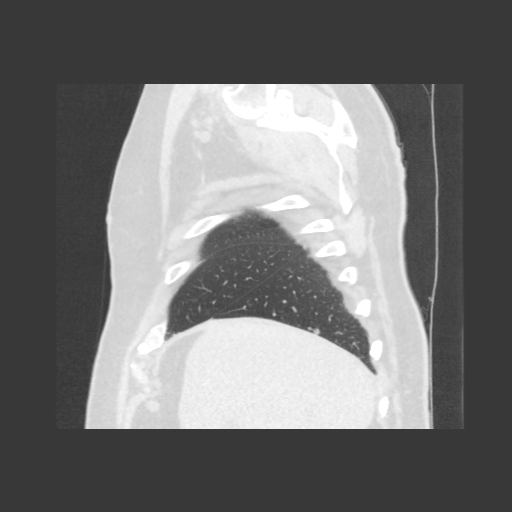
[im 87/218  lung]
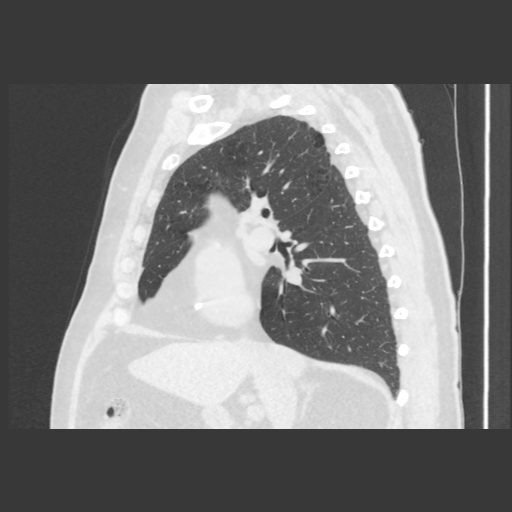
[im 131/218  lung]
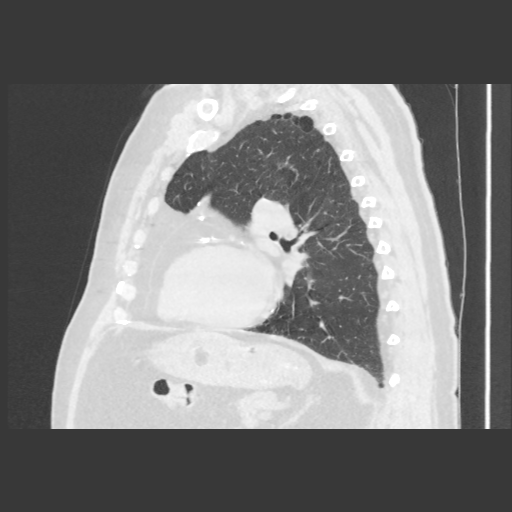

[16 of 36 positions shown; findings below may reference images not displayed]

Canned report from images found in remote index.

Refer to host system for actual result text.

## 2019-05-28 DIAGNOSIS — R197 Diarrhea, unspecified: Secondary | ICD-10-CM | POA: Diagnosis not present

## 2019-05-28 DIAGNOSIS — E039 Hypothyroidism, unspecified: Secondary | ICD-10-CM | POA: Diagnosis not present

## 2019-05-28 DIAGNOSIS — E785 Hyperlipidemia, unspecified: Secondary | ICD-10-CM | POA: Diagnosis not present

## 2019-05-28 DIAGNOSIS — E1142 Type 2 diabetes mellitus with diabetic polyneuropathy: Secondary | ICD-10-CM | POA: Diagnosis not present

## 2019-05-28 DIAGNOSIS — D509 Iron deficiency anemia, unspecified: Secondary | ICD-10-CM | POA: Diagnosis not present

## 2019-05-28 DIAGNOSIS — Z6830 Body mass index (BMI) 30.0-30.9, adult: Secondary | ICD-10-CM | POA: Diagnosis not present

## 2019-05-28 DIAGNOSIS — Z79899 Other long term (current) drug therapy: Secondary | ICD-10-CM | POA: Diagnosis not present

## 2019-05-28 DIAGNOSIS — I10 Essential (primary) hypertension: Secondary | ICD-10-CM | POA: Diagnosis not present

## 2019-05-29 DIAGNOSIS — L84 Corns and callosities: Secondary | ICD-10-CM | POA: Diagnosis not present

## 2019-05-29 DIAGNOSIS — M6702 Short Achilles tendon (acquired), left ankle: Secondary | ICD-10-CM | POA: Diagnosis not present

## 2019-05-29 DIAGNOSIS — M14671 Charcot's joint, right ankle and foot: Secondary | ICD-10-CM | POA: Diagnosis not present

## 2019-06-23 DIAGNOSIS — J069 Acute upper respiratory infection, unspecified: Secondary | ICD-10-CM | POA: Diagnosis not present

## 2019-08-06 DIAGNOSIS — E785 Hyperlipidemia, unspecified: Secondary | ICD-10-CM | POA: Diagnosis not present

## 2019-08-06 DIAGNOSIS — Z6831 Body mass index (BMI) 31.0-31.9, adult: Secondary | ICD-10-CM | POA: Diagnosis not present

## 2019-08-06 DIAGNOSIS — Z1331 Encounter for screening for depression: Secondary | ICD-10-CM | POA: Diagnosis not present

## 2019-08-06 DIAGNOSIS — K219 Gastro-esophageal reflux disease without esophagitis: Secondary | ICD-10-CM | POA: Diagnosis not present

## 2019-08-06 DIAGNOSIS — E039 Hypothyroidism, unspecified: Secondary | ICD-10-CM | POA: Diagnosis not present

## 2019-08-06 DIAGNOSIS — M79604 Pain in right leg: Secondary | ICD-10-CM | POA: Diagnosis not present

## 2019-08-06 DIAGNOSIS — I10 Essential (primary) hypertension: Secondary | ICD-10-CM | POA: Diagnosis not present

## 2019-08-06 DIAGNOSIS — J449 Chronic obstructive pulmonary disease, unspecified: Secondary | ICD-10-CM | POA: Diagnosis not present

## 2019-08-06 DIAGNOSIS — I25119 Atherosclerotic heart disease of native coronary artery with unspecified angina pectoris: Secondary | ICD-10-CM | POA: Diagnosis not present

## 2019-08-06 DIAGNOSIS — E1142 Type 2 diabetes mellitus with diabetic polyneuropathy: Secondary | ICD-10-CM | POA: Diagnosis not present

## 2019-08-06 DIAGNOSIS — M79605 Pain in left leg: Secondary | ICD-10-CM | POA: Diagnosis not present

## 2019-08-06 DIAGNOSIS — M48062 Spinal stenosis, lumbar region with neurogenic claudication: Secondary | ICD-10-CM | POA: Diagnosis not present

## 2019-08-13 ENCOUNTER — Other Ambulatory Visit: Payer: Self-pay | Admitting: Internal Medicine

## 2019-08-13 DIAGNOSIS — M48062 Spinal stenosis, lumbar region with neurogenic claudication: Secondary | ICD-10-CM

## 2019-08-14 ENCOUNTER — Other Ambulatory Visit: Payer: Self-pay | Admitting: Internal Medicine

## 2019-08-14 DIAGNOSIS — M79605 Pain in left leg: Secondary | ICD-10-CM

## 2019-08-14 DIAGNOSIS — M79604 Pain in right leg: Secondary | ICD-10-CM

## 2019-08-20 DIAGNOSIS — E119 Type 2 diabetes mellitus without complications: Secondary | ICD-10-CM | POA: Diagnosis not present

## 2019-08-20 DIAGNOSIS — Z951 Presence of aortocoronary bypass graft: Secondary | ICD-10-CM | POA: Diagnosis not present

## 2019-08-20 DIAGNOSIS — E7800 Pure hypercholesterolemia, unspecified: Secondary | ICD-10-CM | POA: Diagnosis not present

## 2019-08-20 DIAGNOSIS — I10 Essential (primary) hypertension: Secondary | ICD-10-CM | POA: Diagnosis not present

## 2019-08-20 DIAGNOSIS — I252 Old myocardial infarction: Secondary | ICD-10-CM | POA: Diagnosis not present

## 2019-08-20 DIAGNOSIS — I25810 Atherosclerosis of coronary artery bypass graft(s) without angina pectoris: Secondary | ICD-10-CM | POA: Diagnosis not present

## 2019-08-21 ENCOUNTER — Ambulatory Visit
Admission: RE | Admit: 2019-08-21 | Discharge: 2019-08-21 | Disposition: A | Discharge status: Home / Self Care | Payer: PPO | Source: Ambulatory Visit | Attending: Internal Medicine | Admitting: Internal Medicine

## 2019-08-21 DIAGNOSIS — I70213 Atherosclerosis of native arteries of extremities with intermittent claudication, bilateral legs: Secondary | ICD-10-CM | POA: Diagnosis not present

## 2019-08-21 DIAGNOSIS — M79604 Pain in right leg: Secondary | ICD-10-CM

## 2019-08-21 DIAGNOSIS — M79605 Pain in left leg: Secondary | ICD-10-CM

## 2019-09-04 ENCOUNTER — Other Ambulatory Visit: Payer: PPO

## 2019-09-09 DIAGNOSIS — M6701 Short Achilles tendon (acquired), right ankle: Secondary | ICD-10-CM | POA: Diagnosis not present

## 2019-09-09 DIAGNOSIS — L84 Corns and callosities: Secondary | ICD-10-CM | POA: Diagnosis not present

## 2019-09-09 DIAGNOSIS — M14671 Charcot's joint, right ankle and foot: Secondary | ICD-10-CM | POA: Diagnosis not present

## 2019-09-12 DIAGNOSIS — Z1331 Encounter for screening for depression: Secondary | ICD-10-CM | POA: Diagnosis not present

## 2019-09-12 DIAGNOSIS — E1142 Type 2 diabetes mellitus with diabetic polyneuropathy: Secondary | ICD-10-CM | POA: Diagnosis not present

## 2019-09-12 DIAGNOSIS — E785 Hyperlipidemia, unspecified: Secondary | ICD-10-CM | POA: Diagnosis not present

## 2019-09-12 DIAGNOSIS — Z79899 Other long term (current) drug therapy: Secondary | ICD-10-CM | POA: Diagnosis not present

## 2019-09-12 DIAGNOSIS — E039 Hypothyroidism, unspecified: Secondary | ICD-10-CM | POA: Diagnosis not present

## 2019-09-12 DIAGNOSIS — I25119 Atherosclerotic heart disease of native coronary artery with unspecified angina pectoris: Secondary | ICD-10-CM | POA: Diagnosis not present

## 2019-09-12 DIAGNOSIS — Z1159 Encounter for screening for other viral diseases: Secondary | ICD-10-CM | POA: Diagnosis not present

## 2019-09-12 DIAGNOSIS — N401 Enlarged prostate with lower urinary tract symptoms: Secondary | ICD-10-CM | POA: Diagnosis not present

## 2019-09-12 DIAGNOSIS — I10 Essential (primary) hypertension: Secondary | ICD-10-CM | POA: Diagnosis not present

## 2019-09-12 DIAGNOSIS — K219 Gastro-esophageal reflux disease without esophagitis: Secondary | ICD-10-CM | POA: Diagnosis not present

## 2019-09-12 DIAGNOSIS — Z6830 Body mass index (BMI) 30.0-30.9, adult: Secondary | ICD-10-CM | POA: Diagnosis not present

## 2019-09-17 ENCOUNTER — Other Ambulatory Visit: Payer: PPO

## 2019-10-16 DIAGNOSIS — J1282 Pneumonia due to coronavirus disease 2019: Secondary | ICD-10-CM | POA: Diagnosis not present

## 2019-10-16 DIAGNOSIS — Z951 Presence of aortocoronary bypass graft: Secondary | ICD-10-CM | POA: Diagnosis not present

## 2019-10-16 DIAGNOSIS — I2510 Atherosclerotic heart disease of native coronary artery without angina pectoris: Secondary | ICD-10-CM | POA: Diagnosis not present

## 2019-10-16 DIAGNOSIS — J189 Pneumonia, unspecified organism: Secondary | ICD-10-CM | POA: Diagnosis not present

## 2019-10-16 DIAGNOSIS — Z87891 Personal history of nicotine dependence: Secondary | ICD-10-CM | POA: Diagnosis not present

## 2019-10-16 DIAGNOSIS — Z20822 Contact with and (suspected) exposure to covid-19: Secondary | ICD-10-CM | POA: Diagnosis not present

## 2019-10-16 DIAGNOSIS — D709 Neutropenia, unspecified: Secondary | ICD-10-CM | POA: Diagnosis not present

## 2019-10-16 DIAGNOSIS — U071 COVID-19: Secondary | ICD-10-CM | POA: Diagnosis not present

## 2019-10-16 DIAGNOSIS — R0602 Shortness of breath: Secondary | ICD-10-CM | POA: Diagnosis not present

## 2019-10-16 DIAGNOSIS — I10 Essential (primary) hypertension: Secondary | ICD-10-CM | POA: Diagnosis not present

## 2019-10-17 DIAGNOSIS — Z7902 Long term (current) use of antithrombotics/antiplatelets: Secondary | ICD-10-CM | POA: Diagnosis not present

## 2019-10-17 DIAGNOSIS — R824 Acetonuria: Secondary | ICD-10-CM | POA: Diagnosis not present

## 2019-10-17 DIAGNOSIS — R413 Other amnesia: Secondary | ICD-10-CM | POA: Diagnosis not present

## 2019-10-17 DIAGNOSIS — R0602 Shortness of breath: Secondary | ICD-10-CM | POA: Diagnosis not present

## 2019-10-17 DIAGNOSIS — Z87891 Personal history of nicotine dependence: Secondary | ICD-10-CM | POA: Diagnosis not present

## 2019-10-17 DIAGNOSIS — Z8673 Personal history of transient ischemic attack (TIA), and cerebral infarction without residual deficits: Secondary | ICD-10-CM | POA: Diagnosis not present

## 2019-10-17 DIAGNOSIS — Z951 Presence of aortocoronary bypass graft: Secondary | ICD-10-CM | POA: Diagnosis not present

## 2019-10-17 DIAGNOSIS — R410 Disorientation, unspecified: Secondary | ICD-10-CM | POA: Diagnosis not present

## 2019-10-17 DIAGNOSIS — J449 Chronic obstructive pulmonary disease, unspecified: Secondary | ICD-10-CM | POA: Diagnosis not present

## 2019-10-17 DIAGNOSIS — J189 Pneumonia, unspecified organism: Secondary | ICD-10-CM | POA: Diagnosis not present

## 2019-10-17 DIAGNOSIS — E7800 Pure hypercholesterolemia, unspecified: Secondary | ICD-10-CM | POA: Diagnosis not present

## 2019-10-17 DIAGNOSIS — Z79899 Other long term (current) drug therapy: Secondary | ICD-10-CM | POA: Diagnosis not present

## 2019-10-17 DIAGNOSIS — R069 Unspecified abnormalities of breathing: Secondary | ICD-10-CM | POA: Diagnosis not present

## 2019-10-17 DIAGNOSIS — Z9989 Dependence on other enabling machines and devices: Secondary | ICD-10-CM | POA: Diagnosis not present

## 2019-10-17 DIAGNOSIS — Z9119 Patient's noncompliance with other medical treatment and regimen: Secondary | ICD-10-CM | POA: Diagnosis not present

## 2019-10-17 DIAGNOSIS — Z862 Personal history of diseases of the blood and blood-forming organs and certain disorders involving the immune mechanism: Secondary | ICD-10-CM | POA: Diagnosis not present

## 2019-10-17 DIAGNOSIS — Z7982 Long term (current) use of aspirin: Secondary | ICD-10-CM | POA: Diagnosis not present

## 2019-10-17 DIAGNOSIS — Z20822 Contact with and (suspected) exposure to covid-19: Secondary | ICD-10-CM | POA: Diagnosis not present

## 2019-10-17 DIAGNOSIS — M791 Myalgia, unspecified site: Secondary | ICD-10-CM | POA: Diagnosis not present

## 2019-10-17 DIAGNOSIS — I252 Old myocardial infarction: Secondary | ICD-10-CM | POA: Diagnosis not present

## 2019-10-17 DIAGNOSIS — G4733 Obstructive sleep apnea (adult) (pediatric): Secondary | ICD-10-CM | POA: Diagnosis not present

## 2019-10-17 DIAGNOSIS — I2510 Atherosclerotic heart disease of native coronary artery without angina pectoris: Secondary | ICD-10-CM | POA: Diagnosis not present

## 2019-10-17 DIAGNOSIS — U071 COVID-19: Secondary | ICD-10-CM | POA: Diagnosis not present

## 2019-10-17 DIAGNOSIS — R918 Other nonspecific abnormal finding of lung field: Secondary | ICD-10-CM | POA: Diagnosis not present

## 2019-10-21 DIAGNOSIS — N400 Enlarged prostate without lower urinary tract symptoms: Secondary | ICD-10-CM | POA: Diagnosis not present

## 2019-10-21 DIAGNOSIS — R509 Fever, unspecified: Secondary | ICD-10-CM | POA: Diagnosis not present

## 2019-10-21 DIAGNOSIS — R069 Unspecified abnormalities of breathing: Secondary | ICD-10-CM | POA: Diagnosis not present

## 2019-10-21 DIAGNOSIS — N179 Acute kidney failure, unspecified: Secondary | ICD-10-CM | POA: Diagnosis not present

## 2019-10-21 DIAGNOSIS — E870 Hyperosmolality and hypernatremia: Secondary | ICD-10-CM | POA: Diagnosis not present

## 2019-10-21 DIAGNOSIS — I2510 Atherosclerotic heart disease of native coronary artery without angina pectoris: Secondary | ICD-10-CM | POA: Diagnosis not present

## 2019-10-21 DIAGNOSIS — I959 Hypotension, unspecified: Secondary | ICD-10-CM | POA: Diagnosis not present

## 2019-10-21 DIAGNOSIS — E7800 Pure hypercholesterolemia, unspecified: Secondary | ICD-10-CM | POA: Diagnosis not present

## 2019-10-21 DIAGNOSIS — J9690 Respiratory failure, unspecified, unspecified whether with hypoxia or hypercapnia: Secondary | ICD-10-CM | POA: Diagnosis not present

## 2019-10-21 DIAGNOSIS — J9601 Acute respiratory failure with hypoxia: Secondary | ICD-10-CM | POA: Diagnosis not present

## 2019-10-21 DIAGNOSIS — Z515 Encounter for palliative care: Secondary | ICD-10-CM | POA: Diagnosis not present

## 2019-10-21 DIAGNOSIS — J1282 Pneumonia due to coronavirus disease 2019: Secondary | ICD-10-CM | POA: Diagnosis not present

## 2019-10-21 DIAGNOSIS — R05 Cough: Secondary | ICD-10-CM | POA: Diagnosis not present

## 2019-10-21 DIAGNOSIS — A419 Sepsis, unspecified organism: Secondary | ICD-10-CM | POA: Diagnosis not present

## 2019-10-21 DIAGNOSIS — Z7902 Long term (current) use of antithrombotics/antiplatelets: Secondary | ICD-10-CM | POA: Diagnosis not present

## 2019-10-21 DIAGNOSIS — Z7951 Long term (current) use of inhaled steroids: Secondary | ICD-10-CM | POA: Diagnosis not present

## 2019-10-21 DIAGNOSIS — G4733 Obstructive sleep apnea (adult) (pediatric): Secondary | ICD-10-CM | POA: Diagnosis not present

## 2019-10-21 DIAGNOSIS — Z4682 Encounter for fitting and adjustment of non-vascular catheter: Secondary | ICD-10-CM | POA: Diagnosis not present

## 2019-10-21 DIAGNOSIS — E860 Dehydration: Secondary | ICD-10-CM | POA: Diagnosis not present

## 2019-10-21 DIAGNOSIS — N178 Other acute kidney failure: Secondary | ICD-10-CM | POA: Diagnosis not present

## 2019-10-21 DIAGNOSIS — Z7984 Long term (current) use of oral hypoglycemic drugs: Secondary | ICD-10-CM | POA: Diagnosis not present

## 2019-10-21 DIAGNOSIS — I10 Essential (primary) hypertension: Secondary | ICD-10-CM | POA: Diagnosis not present

## 2019-10-21 DIAGNOSIS — R5383 Other fatigue: Secondary | ICD-10-CM | POA: Diagnosis not present

## 2019-10-21 DIAGNOSIS — Z9119 Patient's noncompliance with other medical treatment and regimen: Secondary | ICD-10-CM | POA: Diagnosis not present

## 2019-10-21 DIAGNOSIS — Z87891 Personal history of nicotine dependence: Secondary | ICD-10-CM | POA: Diagnosis not present

## 2019-10-21 DIAGNOSIS — I252 Old myocardial infarction: Secondary | ICD-10-CM | POA: Diagnosis not present

## 2019-10-21 DIAGNOSIS — E039 Hypothyroidism, unspecified: Secondary | ICD-10-CM | POA: Diagnosis not present

## 2019-10-21 DIAGNOSIS — A4189 Other specified sepsis: Secondary | ICD-10-CM | POA: Diagnosis not present

## 2019-10-21 DIAGNOSIS — Z66 Do not resuscitate: Secondary | ICD-10-CM | POA: Diagnosis not present

## 2019-10-21 DIAGNOSIS — J440 Chronic obstructive pulmonary disease with acute lower respiratory infection: Secondary | ICD-10-CM | POA: Diagnosis not present

## 2019-10-21 DIAGNOSIS — E1142 Type 2 diabetes mellitus with diabetic polyneuropathy: Secondary | ICD-10-CM | POA: Diagnosis not present

## 2019-10-21 DIAGNOSIS — Z8673 Personal history of transient ischemic attack (TIA), and cerebral infarction without residual deficits: Secondary | ICD-10-CM | POA: Diagnosis not present

## 2019-10-21 DIAGNOSIS — R0602 Shortness of breath: Secondary | ICD-10-CM | POA: Diagnosis not present

## 2019-10-21 DIAGNOSIS — U071 COVID-19: Secondary | ICD-10-CM | POA: Diagnosis not present

## 2019-10-21 DIAGNOSIS — K219 Gastro-esophageal reflux disease without esophagitis: Secondary | ICD-10-CM | POA: Diagnosis not present

## 2019-10-21 DIAGNOSIS — Z209 Contact with and (suspected) exposure to unspecified communicable disease: Secondary | ICD-10-CM | POA: Diagnosis not present

## 2019-10-21 DIAGNOSIS — Z7901 Long term (current) use of anticoagulants: Secondary | ICD-10-CM | POA: Diagnosis not present

## 2019-10-21 DIAGNOSIS — J449 Chronic obstructive pulmonary disease, unspecified: Secondary | ICD-10-CM | POA: Diagnosis not present

## 2019-10-21 DIAGNOSIS — R0902 Hypoxemia: Secondary | ICD-10-CM | POA: Diagnosis not present

## 2019-10-21 DIAGNOSIS — G9340 Encephalopathy, unspecified: Secondary | ICD-10-CM | POA: Diagnosis not present

## 2019-10-21 DIAGNOSIS — G9349 Other encephalopathy: Secondary | ICD-10-CM | POA: Diagnosis not present

## 2019-11-04 DEATH — deceased

## 2020-03-02 IMAGING — CT CT ABD-PELV W/ CM
2 of 5 series · 17 of 46 positions shown, 19 images · IV contrast (omnipaque)
Comparison: CT chest abdomen pelvis dated 02/24/2017

CLINICAL DATA: Diarrhea, weight loss, status post cholecystectomy
in April 2018

EXAM:
CT ABDOMEN AND PELVIS WITH CONTRAST
TECHNIQUE: Multidetector CT imaging of the abdomen and pelvis was performed
using the standard protocol following bolus administration of
intravenous contrast.
CONTRAST:  100mL OMNIPAQUE IOHEXOL 300 MG/ML  SOLN

[Series 2: axial st · axial · 0.94mm/px · z∈[+526,+956]mm · 14 of 100 slices shown, 16 images]
[im 7/100  soft-tissue]
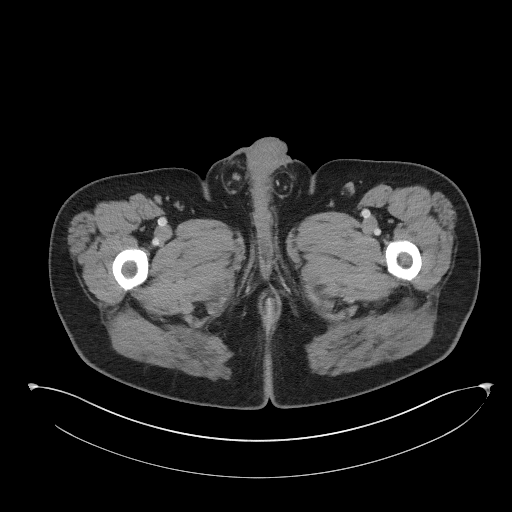
[im 7/100  bone]
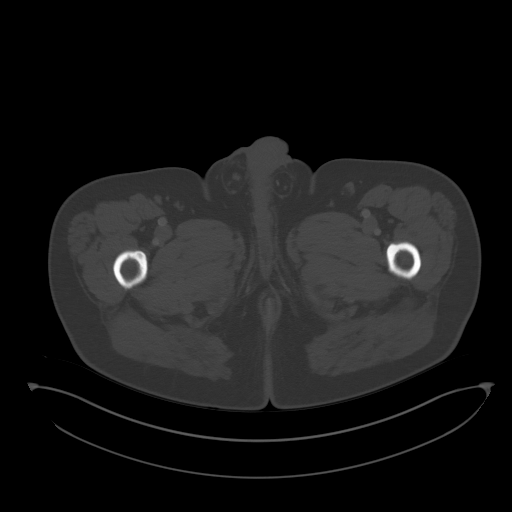
[im 14/100  soft-tissue]
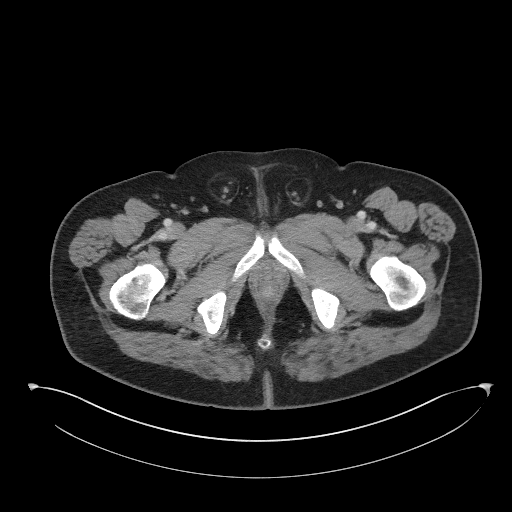
[im 20/100  soft-tissue]
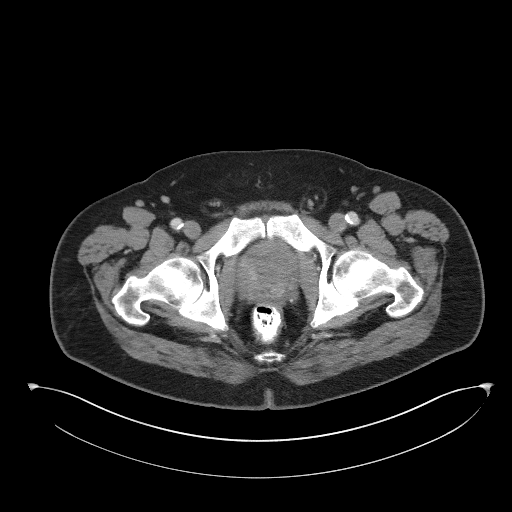
[im 27/100  soft-tissue]
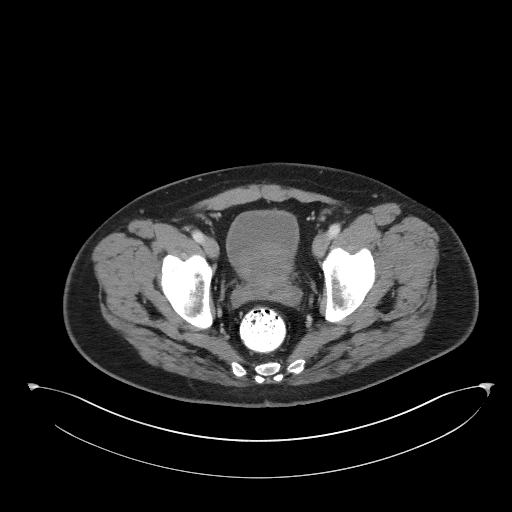
[im 34/100  soft-tissue]
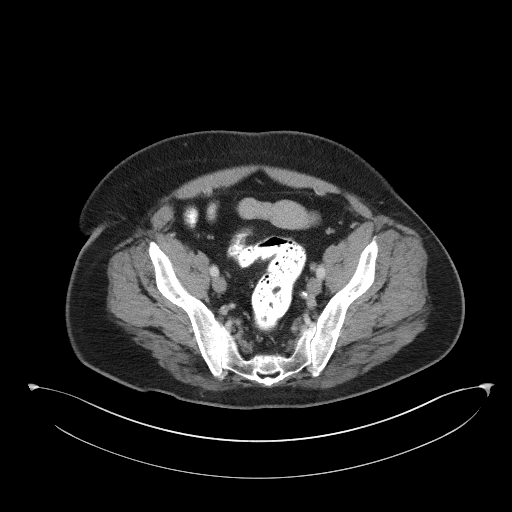
[im 40/100  soft-tissue]
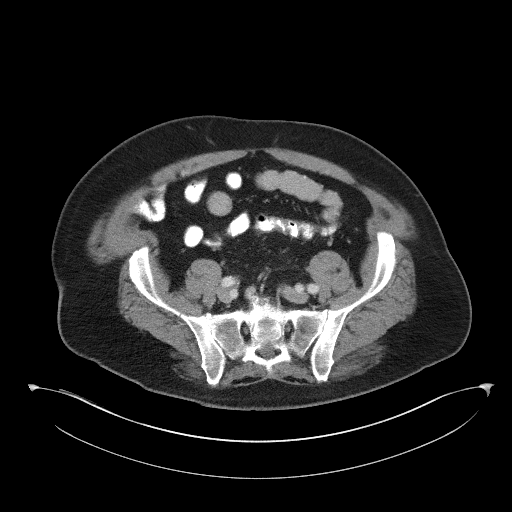
[im 47/100  soft-tissue]
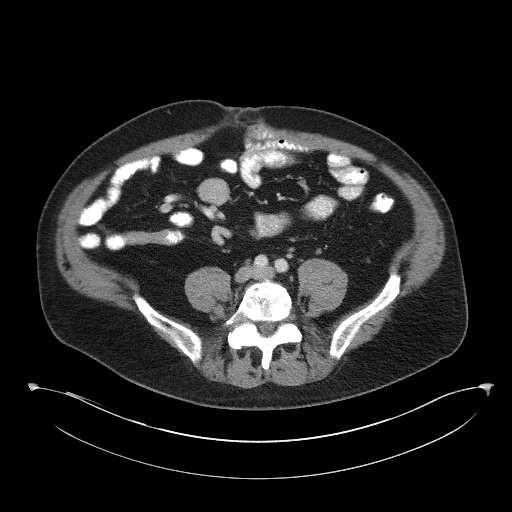
[im 53/100  soft-tissue]
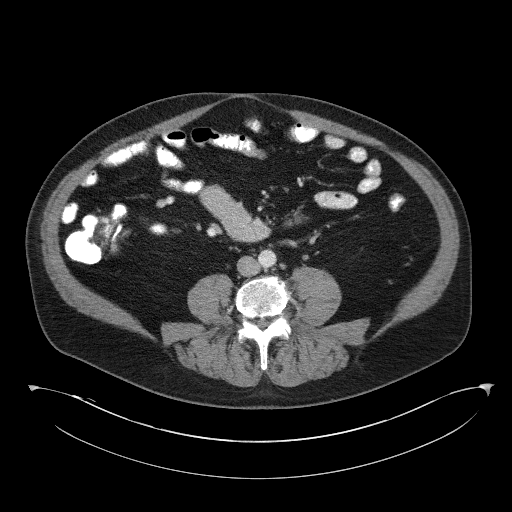
[im 60/100  soft-tissue]
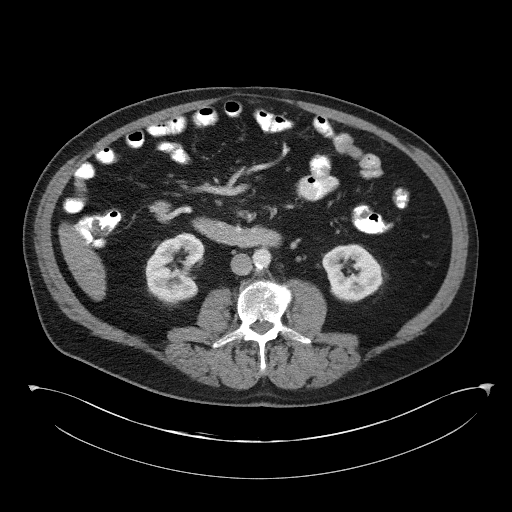
[im 60/100  bone]
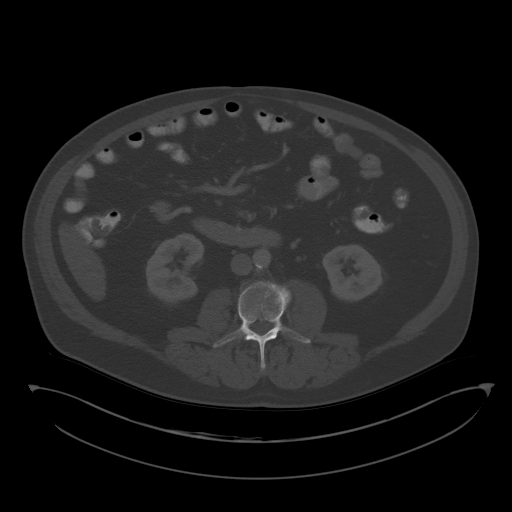
[im 67/100  soft-tissue]
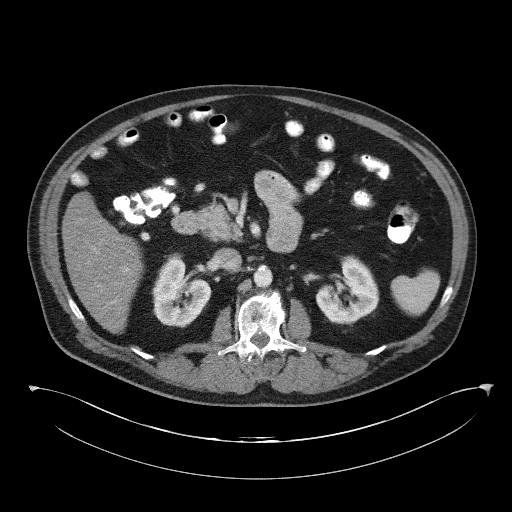
[im 73/100  soft-tissue]
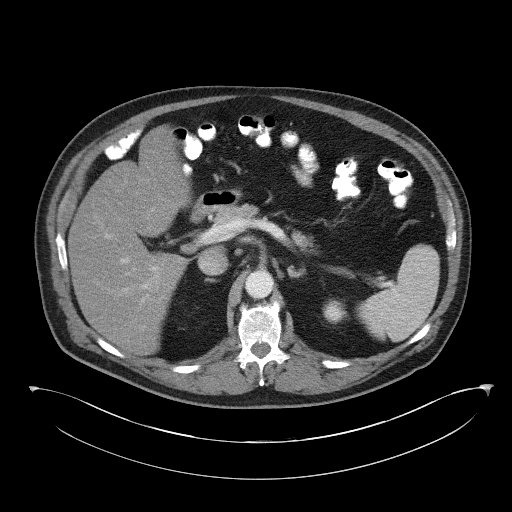
[im 80/100  soft-tissue]
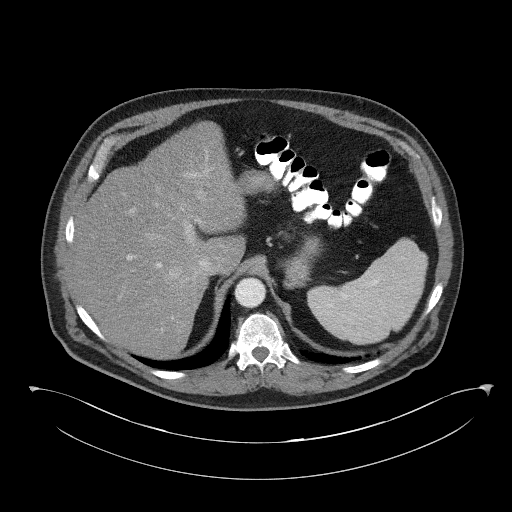
[im 86/100  soft-tissue]
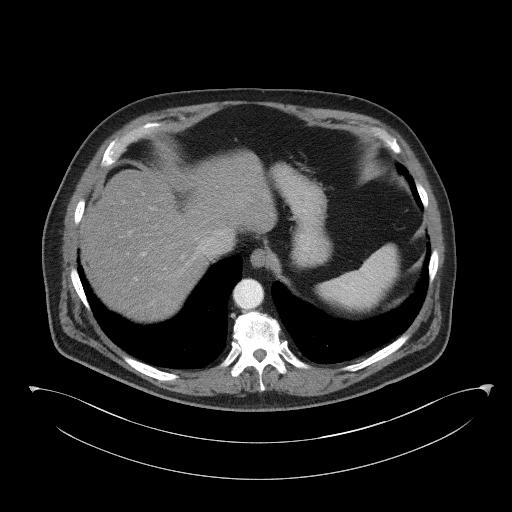
[im 93/100  soft-tissue]
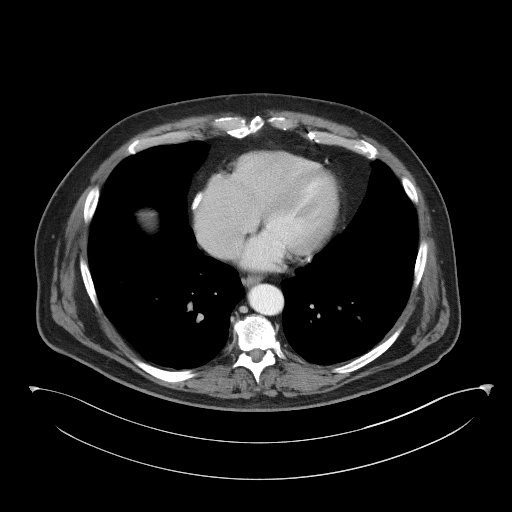

[Series 5: coronal st · coronal · 0.98mm/px · 3 of 103 slices shown]
[im 35/103  soft-tissue]
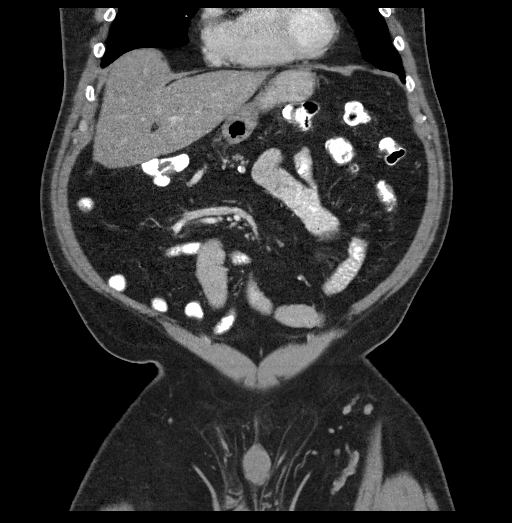
[im 46/103  soft-tissue]
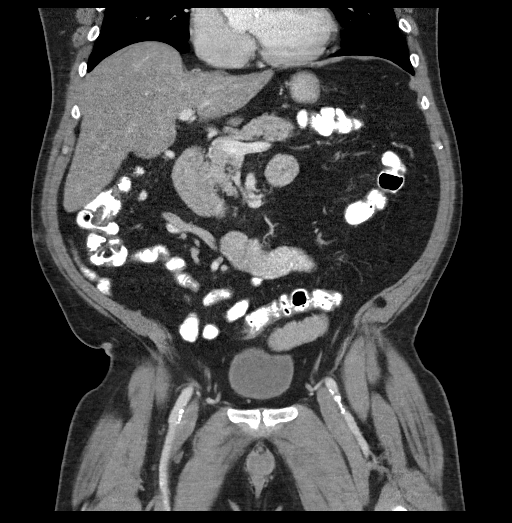
[im 57/103  soft-tissue]
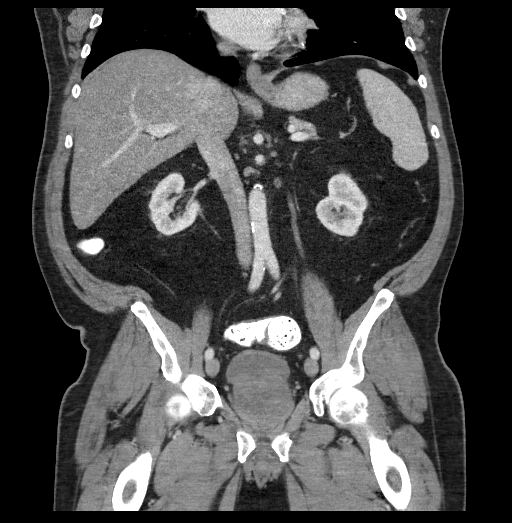

[17 of 46 positions shown; findings below may reference images not displayed]

FINDINGS: Lower chest: 9 mm right middle lobe nodule (series 4/image 1),
incompletely visualized, unchanged from 0765, benign.

Hepatobiliary: Liver is within normal limits.

Status post cholecystectomy. No intrahepatic or extrahepatic ductal
dilatation.

Pancreas: Within normal limits.

Spleen: Within normal limits.

Adrenals/Urinary Tract: Adrenal glands are within normal limits.

Kidneys are within normal limits.  No hydronephrosis.

Bladder is within normal limits.

Stomach/Bowel: Stomach is within normal limits.

No evidence of bowel obstruction.

Appendix is not discretely visualized.

No colonic wall thickening or mass is evident on CT.

Vascular/Lymphatic: No evidence of abdominal aortic aneurysm.

Atherosclerotic calcifications of the abdominal aorta and branch
vessels.

Small upper abdominal lymph nodes which do not meet pathologic CT
size criteria.

Reproductive: Prostatomegaly, with enlargement the central gland,
indenting the base of the bladder. Mild heterogeneous enhancement
along the right apex of the peripheral zone, nonspecific.

Other: No abdominopelvic ascites.

Musculoskeletal: Degenerative changes of the visualized
thoracolumbar spine. Median sternotomy.

Mild superior endplate compression fracture deformity at L5,
chronic.
IMPRESSION: Status post cholecystectomy.

No colonic wall thickening or mass is evident on CT. No CT findings
to account for the patient's weight loss.

Prostatomegaly.

Additional ancillary findings as above.

## 2022-04-22 ENCOUNTER — Encounter: Payer: Self-pay | Admitting: Gastroenterology
